# Patient Record
Sex: Female | Born: 1945 | Race: White | Hispanic: No | Marital: Married | Smoking: Former smoker
Health system: Southern US, Community
[De-identification: ages and names within clinical notes are randomized; demographics above are authoritative.]

## PROBLEM LIST (undated history)

## (undated) DIAGNOSIS — I10 Essential (primary) hypertension: Secondary | ICD-10-CM

## (undated) HISTORY — PX: KNEE SURGERY: SHX244

## (undated) HISTORY — PX: SHOULDER SURGERY: SHX246

---

## 2008-01-22 ENCOUNTER — Other Ambulatory Visit: Payer: Self-pay

## 2008-01-22 ENCOUNTER — Ambulatory Visit: Payer: Self-pay | Admitting: Unknown Physician Specialty

## 2008-01-31 ENCOUNTER — Ambulatory Visit: Payer: Self-pay | Admitting: Unknown Physician Specialty

## 2014-11-26 ENCOUNTER — Inpatient Hospital Stay (HOSPITAL_COMMUNITY): Payer: Medicare HMO

## 2014-11-26 ENCOUNTER — Emergency Department (HOSPITAL_COMMUNITY): Payer: Medicare HMO

## 2014-11-26 ENCOUNTER — Inpatient Hospital Stay (HOSPITAL_COMMUNITY)
Admission: EM | Admit: 2014-11-26 | Discharge: 2014-12-11 | DRG: 023 | Disposition: A | Discharge status: Hospice | Payer: Medicare HMO | Attending: Neurology | Admitting: Neurology

## 2014-11-26 ENCOUNTER — Encounter (HOSPITAL_COMMUNITY): Payer: Self-pay | Admitting: *Deleted

## 2014-11-26 DIAGNOSIS — Z8673 Personal history of transient ischemic attack (TIA), and cerebral infarction without residual deficits: Secondary | ICD-10-CM | POA: Diagnosis not present

## 2014-11-26 DIAGNOSIS — R471 Dysarthria and anarthria: Secondary | ICD-10-CM | POA: Diagnosis present

## 2014-11-26 DIAGNOSIS — G910 Communicating hydrocephalus: Secondary | ICD-10-CM | POA: Diagnosis not present

## 2014-11-26 DIAGNOSIS — G8191 Hemiplegia, unspecified affecting right dominant side: Secondary | ICD-10-CM | POA: Diagnosis not present

## 2014-11-26 DIAGNOSIS — G935 Compression of brain: Secondary | ICD-10-CM | POA: Diagnosis present

## 2014-11-26 DIAGNOSIS — Z880 Allergy status to penicillin: Secondary | ICD-10-CM

## 2014-11-26 DIAGNOSIS — Z882 Allergy status to sulfonamides status: Secondary | ICD-10-CM | POA: Diagnosis not present

## 2014-11-26 DIAGNOSIS — I615 Nontraumatic intracerebral hemorrhage, intraventricular: Secondary | ICD-10-CM | POA: Diagnosis present

## 2014-11-26 DIAGNOSIS — R0600 Dyspnea, unspecified: Secondary | ICD-10-CM | POA: Diagnosis not present

## 2014-11-26 DIAGNOSIS — R0689 Other abnormalities of breathing: Secondary | ICD-10-CM | POA: Diagnosis not present

## 2014-11-26 DIAGNOSIS — H547 Unspecified visual loss: Secondary | ICD-10-CM | POA: Diagnosis present

## 2014-11-26 DIAGNOSIS — Z978 Presence of other specified devices: Secondary | ICD-10-CM

## 2014-11-26 DIAGNOSIS — G936 Cerebral edema: Secondary | ICD-10-CM | POA: Diagnosis present

## 2014-11-26 DIAGNOSIS — R414 Neurologic neglect syndrome: Secondary | ICD-10-CM | POA: Diagnosis present

## 2014-11-26 DIAGNOSIS — Z885 Allergy status to narcotic agent status: Secondary | ICD-10-CM

## 2014-11-26 DIAGNOSIS — D649 Anemia, unspecified: Secondary | ICD-10-CM | POA: Diagnosis present

## 2014-11-26 DIAGNOSIS — I612 Nontraumatic intracerebral hemorrhage in hemisphere, unspecified: Secondary | ICD-10-CM | POA: Diagnosis not present

## 2014-11-26 DIAGNOSIS — Z0000 Encounter for general adult medical examination without abnormal findings: Secondary | ICD-10-CM

## 2014-11-26 DIAGNOSIS — R1310 Dysphagia, unspecified: Secondary | ICD-10-CM | POA: Diagnosis not present

## 2014-11-26 DIAGNOSIS — J9600 Acute respiratory failure, unspecified whether with hypoxia or hypercapnia: Secondary | ICD-10-CM | POA: Diagnosis not present

## 2014-11-26 DIAGNOSIS — H53461 Homonymous bilateral field defects, right side: Secondary | ICD-10-CM | POA: Diagnosis not present

## 2014-11-26 DIAGNOSIS — Y95 Nosocomial condition: Secondary | ICD-10-CM | POA: Diagnosis not present

## 2014-11-26 DIAGNOSIS — J189 Pneumonia, unspecified organism: Secondary | ICD-10-CM | POA: Diagnosis not present

## 2014-11-26 DIAGNOSIS — R001 Bradycardia, unspecified: Secondary | ICD-10-CM | POA: Diagnosis not present

## 2014-11-26 DIAGNOSIS — T8589XA Other specified complication of internal prosthetic devices, implants and grafts, not elsewhere classified, initial encounter: Secondary | ICD-10-CM | POA: Diagnosis not present

## 2014-11-26 DIAGNOSIS — E876 Hypokalemia: Secondary | ICD-10-CM | POA: Diagnosis present

## 2014-11-26 DIAGNOSIS — E871 Hypo-osmolality and hyponatremia: Secondary | ICD-10-CM | POA: Diagnosis not present

## 2014-11-26 DIAGNOSIS — R451 Restlessness and agitation: Secondary | ICD-10-CM | POA: Diagnosis not present

## 2014-11-26 DIAGNOSIS — R739 Hyperglycemia, unspecified: Secondary | ICD-10-CM | POA: Diagnosis not present

## 2014-11-26 DIAGNOSIS — J9601 Acute respiratory failure with hypoxia: Secondary | ICD-10-CM | POA: Diagnosis not present

## 2014-11-26 DIAGNOSIS — E785 Hyperlipidemia, unspecified: Secondary | ICD-10-CM | POA: Diagnosis present

## 2014-11-26 DIAGNOSIS — B9620 Unspecified Escherichia coli [E. coli] as the cause of diseases classified elsewhere: Secondary | ICD-10-CM | POA: Diagnosis not present

## 2014-11-26 DIAGNOSIS — Z452 Encounter for adjustment and management of vascular access device: Secondary | ICD-10-CM

## 2014-11-26 DIAGNOSIS — Z87891 Personal history of nicotine dependence: Secondary | ICD-10-CM | POA: Diagnosis not present

## 2014-11-26 DIAGNOSIS — R4701 Aphasia: Secondary | ICD-10-CM | POA: Diagnosis present

## 2014-11-26 DIAGNOSIS — I610 Nontraumatic intracerebral hemorrhage in hemisphere, subcortical: Principal | ICD-10-CM | POA: Diagnosis present

## 2014-11-26 DIAGNOSIS — Z09 Encounter for follow-up examination after completed treatment for conditions other than malignant neoplasm: Secondary | ICD-10-CM

## 2014-11-26 DIAGNOSIS — T380X5A Adverse effect of glucocorticoids and synthetic analogues, initial encounter: Secondary | ICD-10-CM | POA: Diagnosis present

## 2014-11-26 DIAGNOSIS — K5900 Constipation, unspecified: Secondary | ICD-10-CM | POA: Diagnosis present

## 2014-11-26 DIAGNOSIS — I639 Cerebral infarction, unspecified: Secondary | ICD-10-CM | POA: Diagnosis present

## 2014-11-26 DIAGNOSIS — Z66 Do not resuscitate: Secondary | ICD-10-CM | POA: Diagnosis present

## 2014-11-26 DIAGNOSIS — R339 Retention of urine, unspecified: Secondary | ICD-10-CM | POA: Diagnosis present

## 2014-11-26 DIAGNOSIS — R061 Stridor: Secondary | ICD-10-CM | POA: Insufficient documentation

## 2014-11-26 DIAGNOSIS — R29810 Facial weakness: Secondary | ICD-10-CM | POA: Diagnosis present

## 2014-11-26 DIAGNOSIS — I10 Essential (primary) hypertension: Secondary | ICD-10-CM | POA: Diagnosis present

## 2014-11-26 DIAGNOSIS — G8110 Spastic hemiplegia affecting unspecified side: Secondary | ICD-10-CM | POA: Diagnosis not present

## 2014-11-26 DIAGNOSIS — J690 Pneumonitis due to inhalation of food and vomit: Secondary | ICD-10-CM | POA: Diagnosis not present

## 2014-11-26 DIAGNOSIS — Z87898 Personal history of other specified conditions: Secondary | ICD-10-CM | POA: Diagnosis not present

## 2014-11-26 DIAGNOSIS — G8100 Flaccid hemiplegia affecting unspecified side: Secondary | ICD-10-CM | POA: Diagnosis not present

## 2014-11-26 DIAGNOSIS — I619 Nontraumatic intracerebral hemorrhage, unspecified: Secondary | ICD-10-CM | POA: Diagnosis present

## 2014-11-26 DIAGNOSIS — N3000 Acute cystitis without hematuria: Secondary | ICD-10-CM | POA: Diagnosis not present

## 2014-11-26 DIAGNOSIS — N390 Urinary tract infection, site not specified: Secondary | ICD-10-CM | POA: Diagnosis not present

## 2014-11-26 DIAGNOSIS — Z515 Encounter for palliative care: Secondary | ICD-10-CM | POA: Diagnosis not present

## 2014-11-26 DIAGNOSIS — G911 Obstructive hydrocephalus: Secondary | ICD-10-CM | POA: Diagnosis present

## 2014-11-26 DIAGNOSIS — F1099 Alcohol use, unspecified with unspecified alcohol-induced disorder: Secondary | ICD-10-CM

## 2014-11-26 DIAGNOSIS — T8589XD Other specified complication of internal prosthetic devices, implants and grafts, not elsewhere classified, subsequent encounter: Secondary | ICD-10-CM | POA: Diagnosis not present

## 2014-11-26 DIAGNOSIS — Z4659 Encounter for fitting and adjustment of other gastrointestinal appliance and device: Secondary | ICD-10-CM

## 2014-11-26 HISTORY — DX: Essential (primary) hypertension: I10

## 2014-11-26 LAB — COMPREHENSIVE METABOLIC PANEL
ALT: 30 U/L (ref 14–54)
AST: 30 U/L (ref 15–41)
Albumin: 4.4 g/dL (ref 3.5–5.0)
Alkaline Phosphatase: 66 U/L (ref 38–126)
Anion gap: 12 (ref 5–15)
BILIRUBIN TOTAL: 1.1 mg/dL (ref 0.3–1.2)
BUN: 14 mg/dL (ref 6–20)
CO2: 22 mmol/L (ref 22–32)
CREATININE: 0.66 mg/dL (ref 0.44–1.00)
Calcium: 9.7 mg/dL (ref 8.9–10.3)
Chloride: 98 mmol/L — ABNORMAL LOW (ref 101–111)
GFR calc non Af Amer: >60 mL/min (ref >60)
GLUCOSE: 183 mg/dL — AB (ref 65–99)
Potassium: 4.1 mmol/L (ref 3.5–5.1)
Sodium: 132 mmol/L — ABNORMAL LOW (ref 135–145)
TOTAL PROTEIN: 7.6 g/dL (ref 6.5–8.1)

## 2014-11-26 LAB — DIFFERENTIAL
BASOS ABS: 0 10*3/uL (ref 0.0–0.1)
Basophils Relative: 0 % (ref 0–1)
EOS PCT: 0 % (ref 0–5)
Eosinophils Absolute: 0 10*3/uL (ref 0.0–0.7)
LYMPHS ABS: 0.6 10*3/uL — AB (ref 0.7–4.0)
LYMPHS PCT: 5 % — AB (ref 12–46)
MONOS PCT: 5 % (ref 3–12)
Monocytes Absolute: 0.6 10*3/uL (ref 0.1–1.0)
Neutro Abs: 11.7 10*3/uL — ABNORMAL HIGH (ref 1.7–7.7)
Neutrophils Relative %: 90 % — ABNORMAL HIGH (ref 43–77)

## 2014-11-26 LAB — APTT: APTT: 20 s — AB (ref 24–37)

## 2014-11-26 LAB — CBC
HEMATOCRIT: 40.1 % (ref 36.0–46.0)
HEMOGLOBIN: 14.1 g/dL (ref 12.0–15.0)
MCH: 34.1 pg — AB (ref 26.0–34.0)
MCHC: 35.2 g/dL (ref 30.0–36.0)
MCV: 97.1 fL (ref 78.0–100.0)
Platelets: 209 10*3/uL (ref 150–400)
RBC: 4.13 MIL/uL (ref 3.87–5.11)
RDW: 12.6 % (ref 11.5–15.5)
WBC: 13 10*3/uL — ABNORMAL HIGH (ref 4.0–10.5)

## 2014-11-26 LAB — LIPID PANEL
CHOL/HDL RATIO: 2.9 ratio
CHOLESTEROL: 226 mg/dL — AB (ref 0–200)
HDL: 78 mg/dL (ref >40)
LDL CALC: 139 mg/dL — AB (ref 0–99)
Triglycerides: 47 mg/dL (ref <150)
VLDL: 9 mg/dL (ref 0–40)

## 2014-11-26 LAB — PROTIME-INR
INR: 0.92 (ref 0.00–1.49)
Prothrombin Time: 12.4 seconds (ref 11.6–15.2)

## 2014-11-26 LAB — POCT I-STAT, CHEM 8
BUN: 17 mg/dL (ref 6–20)
CALCIUM ION: 1.09 mmol/L — AB (ref 1.13–1.30)
CHLORIDE: 97 mmol/L — AB (ref 101–111)
CREATININE: 0.6 mg/dL (ref 0.44–1.00)
Glucose, Bld: 189 mg/dL — ABNORMAL HIGH (ref 65–99)
HCT: 45 % (ref 36.0–46.0)
HEMOGLOBIN: 15.3 g/dL — AB (ref 12.0–15.0)
POTASSIUM: 4.1 mmol/L (ref 3.5–5.1)
Sodium: 132 mmol/L — ABNORMAL LOW (ref 135–145)
TCO2: 22 mmol/L (ref 0–100)

## 2014-11-26 LAB — MRSA PCR SCREENING: MRSA BY PCR: NEGATIVE

## 2014-11-26 LAB — CBG MONITORING, ED: Glucose-Capillary: 195 mg/dL — ABNORMAL HIGH (ref 65–99)

## 2014-11-26 LAB — SODIUM: Sodium: 133 mmol/L — ABNORMAL LOW (ref 135–145)

## 2014-11-26 LAB — POCT I-STAT TROPONIN I: Troponin i, poc: 0 ng/mL (ref 0.00–0.08)

## 2014-11-26 LAB — TSH: TSH: 0.629 u[IU]/mL (ref 0.350–4.500)

## 2014-11-26 MED ORDER — PNEUMOCOCCAL VAC POLYVALENT 25 MCG/0.5ML IJ INJ
0.5000 mL | INJECTION | INTRAMUSCULAR | Status: AC
  Administered 2014-11-27: 0.5 mL via INTRAMUSCULAR
  Filled 2014-11-26: qty 0.5

## 2014-11-26 MED ORDER — HYDRALAZINE HCL 20 MG/ML IJ SOLN
10.0000 mg | Freq: Once | INTRAMUSCULAR | Status: AC
  Administered 2014-11-26: 10 mg via INTRAVENOUS
  Filled 2014-11-26: qty 1

## 2014-11-26 MED ORDER — ACETAMINOPHEN 650 MG RE SUPP: 650.0000 mg | RECTAL | Status: DC | PRN

## 2014-11-26 MED ORDER — ONDANSETRON HCL 4 MG/2ML IJ SOLN
4.0000 mg | Freq: Four times a day (QID) | INTRAMUSCULAR | Status: DC | PRN
  Administered 2014-11-27: 4 mg via INTRAVENOUS
  Filled 2014-11-26: qty 2

## 2014-11-26 MED ORDER — STROKE: EARLY STAGES OF RECOVERY BOOK
Freq: Once | Status: AC
  Administered 2014-11-26: 07:00:00
  Filled 2014-11-26: qty 1

## 2014-11-26 MED ORDER — LABETALOL HCL 5 MG/ML IV SOLN
20.0000 mg | Freq: Once | INTRAVENOUS | Status: AC
  Administered 2014-11-26: 20 mg via INTRAVENOUS

## 2014-11-26 MED ORDER — SODIUM CHLORIDE 0.9 % IV SOLN
INTRAVENOUS | Status: DC
  Administered 2014-11-26 – 2014-11-29 (×3): via INTRAVENOUS

## 2014-11-26 MED ORDER — ACETAMINOPHEN 325 MG PO TABS
650.0000 mg | ORAL_TABLET | ORAL | Status: DC | PRN
  Administered 2014-12-05 – 2014-12-09 (×6): 650 mg via ORAL
  Filled 2014-11-26 (×6): qty 2

## 2014-11-26 MED ORDER — PANTOPRAZOLE SODIUM 40 MG IV SOLR
40.0000 mg | Freq: Every day | INTRAVENOUS | Status: DC
  Administered 2014-11-26: 40 mg via INTRAVENOUS
  Filled 2014-11-26 (×2): qty 40

## 2014-11-26 MED ORDER — CETYLPYRIDINIUM CHLORIDE 0.05 % MT LIQD
7.0000 mL | Freq: Two times a day (BID) | OROMUCOSAL | Status: DC
  Administered 2014-11-26 – 2014-11-29 (×7): 7 mL via OROMUCOSAL

## 2014-11-26 MED ORDER — CLEVIDIPINE BUTYRATE 0.5 MG/ML IV EMUL
0.0000 mg/h | INTRAVENOUS | Status: DC
  Administered 2014-11-27: 6 mg/h via INTRAVENOUS
  Administered 2014-11-27: 1 mg/h via INTRAVENOUS
  Administered 2014-11-27: 5 mg/h via INTRAVENOUS
  Filled 2014-11-26 (×62): qty 50

## 2014-11-26 MED ORDER — LABETALOL HCL 5 MG/ML IV SOLN
10.0000 mg | INTRAVENOUS | Status: DC | PRN
  Administered 2014-11-26 (×3): 20 mg via INTRAVENOUS
  Administered 2014-11-26: 40 mg via INTRAVENOUS
  Administered 2014-11-26 – 2014-11-27 (×3): 20 mg via INTRAVENOUS
  Administered 2014-11-28: 40 mg via INTRAVENOUS
  Administered 2014-11-28 (×2): 20 mg via INTRAVENOUS
  Filled 2014-11-26: qty 8
  Filled 2014-11-26 (×5): qty 4
  Filled 2014-11-26: qty 8
  Filled 2014-11-26 (×3): qty 4

## 2014-11-26 MED ORDER — ONDANSETRON HCL 4 MG/2ML IJ SOLN
4.0000 mg | Freq: Once | INTRAMUSCULAR | Status: AC
  Administered 2014-11-26: 4 mg via INTRAVENOUS
  Filled 2014-11-26: qty 2

## 2014-11-26 NOTE — ED Provider Notes (Signed)
CSN: [id number]     Arrival date & time 11/26/14  0506 History   First MD Initiated Contact with Patient 11/26/14 0518     Chief Complaint  Patient presents with  . Code Stroke    An emergency department physician performed an initial assessment on this suspected stroke patient at [id number]. (Consider location/radiation/quality/duration/timing/severity/associated sxs/prior Treatment) The history is provided by the EMS personnel. The history is limited by the condition of the patient (Aphasia).   69 year old female is brought in by ambulance as a code stroke. She was last seen normal low when her husband went to bed at 9 AM. He will found her on the floor at 4 AM and was noted to not be moving her right side. Patient is able to nod her head yes and states that she is not hurting anywhere.  No past medical history on file. No past surgical history on file. No family history on file. History  Substance Use Topics  . Smoking status: Not on file  . Smokeless tobacco: Not on file  . Alcohol Use: Not on file   OB History    No data available     Review of Systems  Unable to perform ROS: Patient nonverbal      Allergies  Penicillins and Codeine  Home Medications   Prior to Admission medications   Not on File   BP 184/72 mmHg  Pulse 98  Temp(Src) 98.2 F (36.8 C) (Oral)  Resp 29  Ht 5\' 4"  (1.626 m)  Wt 160 lb 4.4 oz (72.7 kg)  BMI 27.50 kg/m2  SpO2 100% Physical Exam  Nursing note and vitals reviewed.  69 year old female, resting comfortably and in no acute distress. Vital signs are significant for hypertension and tachypnea. Oxygen saturation is 100%, which is normal. Head is normocephalic and atraumatic. Pupils are 3 mm and sluggishly reactive Oropharynx is clear. Neck is nontender and supple without adenopathy or JVD. There are no carotid bruits. Back is nontender and there is no CVA tenderness. Lungs are clear without rales, wheezes, or rhonchi. Chest is  nontender. Heart has regular rate and rhythm without murmur. Abdomen is soft, flat, nontender without masses or hepatosplenomegaly and peristalsis is normoactive. Extremities have no cyanosis or edema, full range of motion is present. Skin is warm and dry without rash. Neurologic: She is awake but does not follow commands well. Mild right central facial droop is present. Dense right hemiparesis is present and right Babinski reflex is present.  ED Course  Procedures (including critical care time) Labs Review Results for orders placed or performed during the hospital encounter of 11/26/14  Protime-INR  Result Value Ref Range   Prothrombin Time 12.4 11.6 - 15.2 seconds   INR 0.92 0.00 - 1.49  APTT  Result Value Ref Range   aPTT 20 (L) 24 - 37 seconds  CBC  Result Value Ref Range   WBC 13.0 (H) 4.0 - 10.5 K/uL   RBC 4.13 3.87 - 5.11 MIL/uL   Hemoglobin 14.1 12.0 - 15.0 g/dL   HCT 40.940.1 81.136.0 - 91.446.0 %   MCV 97.1 78.0 - 100.0 fL   MCH 34.1 (H) 26.0 - 34.0 pg   MCHC 35.2 30.0 - 36.0 g/dL   RDW 78.212.6 95.611.5 - 21.315.5 %   Platelets 209 150 - 400 K/uL  Differential  Result Value Ref Range   Neutrophils Relative % 90 (H) 43 - 77 %   Neutro Abs 11.7 (H) 1.7 - 7.7 K/uL  Lymphocytes Relative 5 (L) 12 - 46 %   Lymphs Abs 0.6 (L) 0.7 - 4.0 K/uL   Monocytes Relative 5 3 - 12 %   Monocytes Absolute 0.6 0.1 - 1.0 K/uL   Eosinophils Relative 0 0 - 5 %   Eosinophils Absolute 0.0 0.0 - 0.7 K/uL   Basophils Relative 0 0 - 1 %   Basophils Absolute 0.0 0.0 - 0.1 K/uL  Comprehensive metabolic panel  Result Value Ref Range   Sodium 132 (L) 135 - 145 mmol/L   Potassium 4.1 3.5 - 5.1 mmol/L   Chloride 98 (L) 101 - 111 mmol/L   CO2 22 22 - 32 mmol/L   Glucose, Bld 183 (H) 65 - 99 mg/dL   BUN 14 6 - 20 mg/dL   Creatinine, Ser 1.61 0.44 - 1.00 mg/dL   Calcium 9.7 8.9 - 09.6 mg/dL   Total Protein 7.6 6.5 - 8.1 g/dL   Albumin 4.4 3.5 - 5.0 g/dL   AST 30 15 - 41 U/L   ALT 30 14 - 54 U/L   Alkaline  Phosphatase 66 38 - 126 U/L   Total Bilirubin 1.1 0.3 - 1.2 mg/dL   GFR calc non Af Amer >60 >60 mL/min   GFR calc Af Amer >60 >60 mL/min   Anion gap 12 5 - 15  CBG monitoring, ED  Result Value Ref Range   Glucose-Capillary 195 (H) 65 - 99 mg/dL   Imaging Review Ct Head (brain) Wo Contrast  11/26/2014   CLINICAL DATA:  Code stroke.  Right-sided deficits.  Vomiting.  EXAM: CT HEAD WITHOUT CONTRAST  TECHNIQUE: Contiguous axial images were obtained from the base of the skull through the vertex without intravenous contrast.  COMPARISON:  None.  FINDINGS: There is an acute 3.7 x 1.9 cm left basal gangliar bleed, with decompression into the ventricles. Moderate amount of intraventricular blood dependently, left greater than right. There is 5 mm left-to-right midline shift. Mild ventricular prominence likely related to central atrophy, no frank hydrocephalus. The basilar cisterns remain patent. No subdural hemorrhage. Minimal inflammatory change in right side of sphenoid sinus. Calvarium is intact. The mastoid air cells are well aerated.  IMPRESSION: Acute 3.7 cm bleed in the left basal ganglia with decompression into the ventricles, moderate volume of intraventricular blood. There is 5 mm left-to-right midline shift.  Critical Value/emergent results were called by telephone at the time of interpretation on 11/26/2014 at 5:25 am to Dr. Roseanne Reno , who verbally acknowledged these results.   Electronically Signed   By: Rubye Oaks M.D.   On: 11/26/2014 05:26   Images viewed by me.   EKG Interpretation   Date/Time:  Wednesday Nov 26 2014 05:24:20 EDT Ventricular Rate:  91 PR Interval:  179 QRS Duration: 97 QT Interval:  388 QTC Calculation: 477 R Axis:   44 Text Interpretation:  Sinus rhythm RSR' in V1 or V2, probably normal  variant When compared with ECG of 01/22/2008, No significant change was  found Confirmed by [facility]  MD, Onaje Warne ([id number]) on 11/26/2014 5:35:37 AM      CRITICAL CARE Performed  by: [email or web address],JYNWG Total critical care time: 35 minutes Critical care time was exclusive of separately billable procedures and treating other patients. Critical care was necessary to treat or prevent imminent or life-threatening deterioration. Critical care was time spent personally by me on the following activities: development of treatment plan with patient and/or surrogate as well as nursing, discussions with consultants, evaluation of patient's response to treatment, examination  of patient, obtaining history from patient or surrogate, ordering and performing treatments and interventions, ordering and review of laboratory studies, ordering and review of radiographic studies, pulse oximetry and re-evaluation of patient's condition.  MDM   Final diagnoses:  Left Basal Ganglia Bleed    Stroke with right hemiparesis and aphasia. CT scan is obtained showing a left basal ganglia bleed. Case was seen in conjunction with Dr. Roseanne RenoStewart of neurology services and he is admitting the patient to the neurology service.  Her husband has arrived and I have discussed the CT findings with him and their implication.   Dione Boozeavid Jacalyn Biggs, MD 11/26/14 [phone]

## 2014-11-26 NOTE — Progress Notes (Signed)
STROKE TEAM PROGRESS NOTE   SUBJECTIVE (INTERVAL HISTORY) Her family member are at the bedside.  Overall her condition is not stable yet. She is still very lethargic with aphasia and right side hemiplegia. BP fluctuating and put on cleviprex drip. BP goal < 140.   OBJECTIVE Temp:  [98.2 F (36.8 C)-98.6 F (37 C)] 98.2 F (36.8 C) (05/18 0750) Pulse Rate:  [82-98] 84 (05/18 1300) Cardiac Rhythm:  [-] Normal sinus rhythm (05/18 0657) Resp:  [18-29] 21 (05/18 1300) BP: (100-200)/(53-96) 153/64 mmHg (05/18 1300) SpO2:  [94 %-100 %] 94 % (05/18 1300) Weight:  [160 lb 4.4 oz (72.7 kg)] 160 lb 4.4 oz (72.7 kg) (05/18 0657)   Recent Labs Lab 11/26/14 0536  GLUCAP 195*    Recent Labs Lab 11/26/14 0510 11/26/14 0517  NA 132* 132*  K 4.1 4.1  CL 98* 97*  CO2 22  --   GLUCOSE 183* 189*  BUN 14 17  CREATININE 0.66 0.60  CALCIUM 9.7  --     Recent Labs Lab 11/26/14 0510  AST 30  ALT 30  ALKPHOS 66  BILITOT 1.1  PROT 7.6  ALBUMIN 4.4    Recent Labs Lab 11/26/14 0510 11/26/14 0517  WBC 13.0*  --   NEUTROABS 11.7*  --   HGB 14.1 15.3*  HCT 40.1 45.0  MCV 97.1  --   PLT 209  --    No results for input(s): CKTOTAL, CKMB, CKMBINDEX, TROPONINI in the last 168 hours.  Recent Labs  11/26/14 0510  LABPROT 12.4  INR 0.92   No results for input(s): COLORURINE, LABSPEC, PHURINE, GLUCOSEU, HGBUR, BILIRUBINUR, KETONESUR, PROTEINUR, UROBILINOGEN, NITRITE, LEUKOCYTESUR in the last 72 hours.  Invalid input(s): APPERANCEUR     Component Value Date/Time   CHOL 226* 11/26/2014 1200   TRIG 47 11/26/2014 1200   HDL 78 11/26/2014 1200   CHOLHDL 2.9 11/26/2014 1200   VLDL 9 11/26/2014 1200   LDLCALC 139* 11/26/2014 1200   No results found for: HGBA1C No results found for: LABOPIA, COCAINSCRNUR, LABBENZ, AMPHETMU, THCU, LABBARB  No results for input(s): ETH in the last 168 hours.  I have personally reviewed the radiological images below and agree with the radiology  interpretations.  Ct Head (brain) Wo Contrast  11/26/2014   IMPRESSION: Acute 3.7 cm bleed in the left basal ganglia with decompression into the ventricles, moderate volume of intraventricular blood. There is 5 mm left-to-right midline shift.     Chest Port 1 View  11/26/2014    IMPRESSION: Prominence of the pulmonary interstitial markings and probable small left pleural effusion. Without previous studies it is not possible no with these findings are acute or old. A PA and lateral chest x-ray with deep inspiratory effort would be useful.    2D Echocardiogram  pending  EKG  normal sinus rhythm. For complete results please see formal report.  PHYSICAL EXAM  Temp:  [98.2 F (36.8 C)-98.6 F (37 C)] 98.2 F (36.8 C) (05/18 0750) Pulse Rate:  [82-98] 84 (05/18 1300) Resp:  [18-29] 21 (05/18 1300) BP: (100-200)/(53-96) 153/64 mmHg (05/18 1300) SpO2:  [94 %-100 %] 94 % (05/18 1300) Weight:  [160 lb 4.4 oz (72.7 kg)] 160 lb 4.4 oz (72.7 kg) (05/18 0657)  General - Well nourished, well developed, lethargic and drowsy.  Ophthalmologic - not cooperative on exam.  Cardiovascular - Regular rate and rhythm with no murmur.  Neuro - very lethargic and drowsy, fall back to sleep without stimulation. Non verbal and not following  commands. Eyes open on voice, disconjugated eyes, gaze left preference, able to cross midline, not blinking to visual threat on the right, facial symmetrical, tongue in middle in mouth. Right UE 0/5 and RLE 2+/5 on pain stimulation. LUE and LLE spontaneous movement. Right babinski positive. Reflex 1+. Sensation, coordination and gait not tested.   ASSESSMENT/PLAN Kathryn Khan is a 69 y.o. female with history of HTN admitted for left BG hemorrhage with intraventricular extension. Symptoms stable but guarded.    Left BG hemorrhage with intraventricular extension - likely due to hypertensive bleed  CT showed left BG hemorrhage with ventricular extension  Repeat CT  3pm today  2D Echo  pending  LDL 139, not at the goal  [id number] pending  SCDs  for VTE prophylaxis  Diet NPO time specified   no antithrombotic prior to admission, now on no antithrombotic  BP control with goal < 140  Ongoing aggressive stroke risk factor management  Therapy recommendations:  pending  Disposition:  Pending  Cerebral edema  Currently no need for hypertonic saline  Repeat CT this pm to evaluate midline shift  3% saline if needed  BP control  Full code  Close neuro check  Hypertension  Home meds:   lisinopril Currently on cleviprex drip BP goal < 140 Consider po meds once passed swallow  Unstable  Patient counseled to be compliant with her blood pressure medications  Hyperlipidemia  Home meds:  none   Currently on none  LDL 139, goal < 70  Hold off statin now due to ICH and no po access  Continue statin at discharge  Other Stroke Risk Factors  Advanced age  ETOH use - 2-3 drinks daily  Other Active Problems  Hyponatremia - repeat Na 2pm today  leukocytosis  Other Pertinent History  Not check BP at home  Hospital day # 0  This patient is critically ill due to ICH with ventricular extension and at significant risk of neurological worsening, death form hematoma extension, cerebral edema and brain herniation. This patient's care requires constant monitoring of vital signs, hemodynamics, respiratory and cardiac monitoring, review of multiple databases, neurological assessment, discussion with family, other specialists and medical decision making of high complexity. I spent 50 minutes of neurocritical care time in the care of this patient.   Marvel PlanJindong Amire Leazer, MD PhD Stroke Neurology 11/26/2014 1:31 PM    To contact Stroke Continuity provider, please refer to [email or web address]. After hours, contact General Neurology

## 2014-11-26 NOTE — Code Documentation (Signed)
Kathryn Khan is a 69yo wf who was found on the floor by her husband when he got up to go to work.  He last saw her normal at 0900 when he went to bed.  She was watching TV so she did not go to bed at that time.  Upon EMS arrival she was hypertensive BP 220/117 and was flaccid on the Rt side.  NIH 24 for Rt side flaccid, facial droop, global aphasia, and sensory deficit.

## 2014-11-26 NOTE — ED Notes (Signed)
Pt CBG, 195. Nurse was notified.

## 2014-11-26 NOTE — H&P (Addendum)
Admission H&P    Chief Complaint: New onset aphasia and right hemiplegia.  HPI: Kathryn Khan is an 69 y.o. female with a history of hypertension, brought to the emergency room and code stroke status after being found on the floor by her husband at 4 AM this morning. She was unable to speak and was not moving her right side. She was last seen well at 9 PM asked night. She was noted to be hypertensive with blood pressure of 200/117. CT scan of her head showed an acute 3.7 cm hemorrhage involving left basal ganglia with extension into the left lateral ventricle. There was a 5 mm left-to-right midline shift. Patient was nauseated and was given Zofran milligrams IV, in addition to 20 mg of labetalol for management of hypertensive urgency. NIH stroke score was 24. Patient was admitted to neuro intensive care unit.  LSN: 9 PM on 11/25/2014 tPA Given: No: Acute ICH/IVH mRankin:  Past medical history: Hypertension  Surgical history: Unavailable..  Family history: Negative for stroke, heart disease and hypertension.  Social History:  has no tobacco, alcohol, and drug history on file.   Allergies:  Allergies  Allergen Reactions  . Penicillins Anaphylaxis  . Codeine Nausea And Vomiting    Medications: Lisinopril  ROS: History obtained from patient spouse and daughter.  General ROS: negative for - chills, fatigue, fever, night sweats, weight gain or weight loss Psychological ROS: negative for - behavioral disorder, hallucinations, memory difficulties, mood swings or suicidal ideation Ophthalmic ROS: negative for - blurry vision, double vision, eye pain or loss of vision ENT ROS: negative for - epistaxis, nasal discharge, oral lesions, sore throat, tinnitus or vertigo Allergy and Immunology ROS: negative for - hives or itchy/watery eyes Hematological and Lymphatic ROS: negative for - bleeding problems, bruising or swollen lymph nodes Endocrine ROS: negative for - galactorrhea, hair  pattern changes, polydipsia/polyuria or temperature intolerance Respiratory ROS: negative for - cough, hemoptysis, shortness of breath or wheezing Cardiovascular ROS: negative for - chest pain, dyspnea on exertion, edema or irregular heartbeat Gastrointestinal ROS: negative for - abdominal pain, diarrhea, hematemesis, nausea/vomiting or stool incontinence Genito-Urinary ROS: negative for - dysuria, hematuria, incontinence or urinary frequency/urgency Musculoskeletal ROS: negative for - joint swelling or muscular weakness Neurological ROS: as noted in HPI Dermatological ROS: negative for rash and skin lesion changes  Physical Examination: Vital signs: Temperature 98.6, pulse 84, respirations 23, blood pressure 188/96.  Patient was lethargic but in no acute distress.  HEENT-  Normocephalic, no lesions, without obvious abnormality.  Normal external eye and conjunctiva.  Normal TM's bilaterally.  Normal auditory canals and external ears. Normal external nose, mucus membranes and septum.  Normal pharynx. Neck supple with no masses, nodes, nodules or enlargement. Cardiovascular - regular rate and rhythm, S1, S2 normal, no murmur, click, rub or gallop Lungs - chest clear, no wheezing, rales, normal symmetric air entry Abdomen - soft, non-tender; bowel sounds normal; no masses,  no organomegaly Extremities - no joint deformities, effusion, or inflammation and no edema  Neurologic Examination: Mental Status: Lethargic, globally aphasic, unable to follow any commands. She had total neglect of right side. Cranial Nerves: II-dense right homonymous hemianopsia. III/IV/VI-Pupils were equal and reacted. Gaze deviation to the left side with no movement towards the right beyond midline.    VII-moderate right lower facial weakness. VIII-unable to adequately assess. X-no speech output. XI: trapezius strength/neck flexion strength normal bilaterally XII-midline tongue extension with normal  strength. Motor: Flaccid right hemiplegia; normal strength of left extremities. Sensory:  Total neglect of left side. Deep Tendon Reflexes: 2+ and symmetric. Plantars: Extensor on the right and flexor on the left. Cerebellar: Unable to assess. Carotid auscultation: Normal  Results for orders placed or performed during the hospital encounter of 11/26/14 (from the past 48 hour(s))  Protime-INR     Status: None   Collection Time: 11/26/14  5:10 AM  Result Value Ref Range   Prothrombin Time 12.4 11.6 - 15.2 seconds   INR 0.92 0.00 - 1.49  APTT     Status: Abnormal   Collection Time: 11/26/14  5:10 AM  Result Value Ref Range   aPTT 20 (L) 24 - 37 seconds  CBC     Status: Abnormal   Collection Time: 11/26/14  5:10 AM  Result Value Ref Range   WBC 13.0 (H) 4.0 - 10.5 K/uL   RBC 4.13 3.87 - 5.11 MIL/uL   Hemoglobin 14.1 12.0 - 15.0 g/dL   HCT 54.0 98.1 - 19.1 %   MCV 97.1 78.0 - 100.0 fL   MCH 34.1 (H) 26.0 - 34.0 pg   MCHC 35.2 30.0 - 36.0 g/dL   RDW 47.8 29.5 - 62.1 %   Platelets 209 150 - 400 K/uL  Differential     Status: Abnormal   Collection Time: 11/26/14  5:10 AM  Result Value Ref Range   Neutrophils Relative % 90 (H) 43 - 77 %   Neutro Abs 11.7 (H) 1.7 - 7.7 K/uL   Lymphocytes Relative 5 (L) 12 - 46 %   Lymphs Abs 0.6 (L) 0.7 - 4.0 K/uL   Monocytes Relative 5 3 - 12 %   Monocytes Absolute 0.6 0.1 - 1.0 K/uL   Eosinophils Relative 0 0 - 5 %   Eosinophils Absolute 0.0 0.0 - 0.7 K/uL   Basophils Relative 0 0 - 1 %   Basophils Absolute 0.0 0.0 - 0.1 K/uL  CBG monitoring, ED     Status: Abnormal   Collection Time: 11/26/14  5:36 AM  Result Value Ref Range   Glucose-Capillary 195 (H) 65 - 99 mg/dL   Ct Head (brain) Wo Contrast  11/26/2014   CLINICAL DATA:  Code stroke.  Right-sided deficits.  Vomiting.  EXAM: CT HEAD WITHOUT CONTRAST  TECHNIQUE: Contiguous axial images were obtained from the base of the skull through the vertex without intravenous contrast.  COMPARISON:   None.  FINDINGS: There is an acute 3.7 x 1.9 cm left basal gangliar bleed, with decompression into the ventricles. Moderate amount of intraventricular blood dependently, left greater than right. There is 5 mm left-to-right midline shift. Mild ventricular prominence likely related to central atrophy, no frank hydrocephalus. The basilar cisterns remain patent. No subdural hemorrhage. Minimal inflammatory change in right side of sphenoid sinus. Calvarium is intact. The mastoid air cells are well aerated.  IMPRESSION: Acute 3.7 cm bleed in the left basal ganglia with decompression into the ventricles, moderate volume of intraventricular blood. There is 5 mm left-to-right midline shift.  Critical Value/emergent results were called by telephone at the time of interpretation on 11/26/2014 at 5:25 am to Dr. Roseanne Reno , who verbally acknowledged these results.   Electronically Signed   By: Rubye Oaks M.D.   On: 11/26/2014 05:26    Assessment: 69 y.o. female presenting with a large left basal ganglia hemorrhage with ventricular extension and mild mass effect, as well as hypertensive urgency.  Stroke Risk Factors - hypertension  Plan: 1. HgbA1c, fasting lipid panel 2. MRI, MRA  of the brain without  contrast 3. PT consult, OT consult, Speech consult 4. Echocardiogram 5. Carotid dopplers 6. Prophylactic therapy-None 7. Risk factor modification 8. Telemetry monitoring  This patient is critically ill and at significant risk of neurological worsening or death, and care requires constant monitoring of vital signs, hemodynamics,respiratory and cardiac monitoring, neurological assessment, discussion with family, other specialists and medical decision making of high complexity. Total critical care time was 60 minutes.  C.R. Roseanne RenoStewart, MD [facility] [phone]  11/26/2014, 5:44 AM

## 2014-11-26 NOTE — Progress Notes (Signed)
UR completed.  Await acute therapy evals to determine pt's needs for d/c.   Carlyle LipaMichelle Charitie Hinote, RN BSN MHA CCM Trauma/Neuro ICU Case Manager [phone]

## 2014-11-26 NOTE — ED Notes (Signed)
Pt arrives from home via [facility]. Pt was found on the floor near her chair by husband when he got up for work around 4AM. Pt has right sided paralysis, aphasic, with signs of vomiting on clothing.

## 2014-11-27 ENCOUNTER — Inpatient Hospital Stay (HOSPITAL_COMMUNITY): Payer: Medicare HMO

## 2014-11-27 DIAGNOSIS — I610 Nontraumatic intracerebral hemorrhage in hemisphere, subcortical: Secondary | ICD-10-CM | POA: Diagnosis not present

## 2014-11-27 DIAGNOSIS — R4701 Aphasia: Secondary | ICD-10-CM

## 2014-11-27 DIAGNOSIS — N3000 Acute cystitis without hematuria: Secondary | ICD-10-CM

## 2014-11-27 DIAGNOSIS — G8110 Spastic hemiplegia affecting unspecified side: Secondary | ICD-10-CM

## 2014-11-27 LAB — HEMOGLOBIN A1C
Hgb A1c MFr Bld: 5.7 % — ABNORMAL HIGH (ref 4.8–5.6)
Mean Plasma Glucose: 117 mg/dL

## 2014-11-27 LAB — CBC
HEMATOCRIT: 39.2 % (ref 36.0–46.0)
Hemoglobin: 13 g/dL (ref 12.0–15.0)
MCH: 32.4 pg (ref 26.0–34.0)
MCHC: 33.2 g/dL (ref 30.0–36.0)
MCV: 97.8 fL (ref 78.0–100.0)
PLATELETS: 200 10*3/uL (ref 150–400)
RBC: 4.01 MIL/uL (ref 3.87–5.11)
RDW: 12.8 % (ref 11.5–15.5)
WBC: 8.8 10*3/uL (ref 4.0–10.5)

## 2014-11-27 LAB — URINE MICROSCOPIC-ADD ON

## 2014-11-27 LAB — URINALYSIS, ROUTINE W REFLEX MICROSCOPIC
Bilirubin Urine: NEGATIVE
GLUCOSE, UA: NEGATIVE mg/dL
KETONES UR: 15 mg/dL — AB
Nitrite: NEGATIVE
PH: 6 (ref 5.0–8.0)
PROTEIN: NEGATIVE mg/dL
Specific Gravity, Urine: 1.015 (ref 1.005–1.030)
Urobilinogen, UA: 0.2 mg/dL (ref 0.0–1.0)

## 2014-11-27 LAB — SODIUM, URINE, RANDOM: Sodium, Ur: 183 mmol/L

## 2014-11-27 LAB — BASIC METABOLIC PANEL
Anion gap: 10 (ref 5–15)
BUN: 6 mg/dL (ref 6–20)
CO2: 24 mmol/L (ref 22–32)
CREATININE: 0.48 mg/dL (ref 0.44–1.00)
Calcium: 8.8 mg/dL — ABNORMAL LOW (ref 8.9–10.3)
Chloride: 97 mmol/L — ABNORMAL LOW (ref 101–111)
GFR calc Af Amer: >60 mL/min (ref >60)
GFR calc non Af Amer: >60 mL/min (ref >60)
Glucose, Bld: 173 mg/dL — ABNORMAL HIGH (ref 65–99)
Potassium: 3.1 mmol/L — ABNORMAL LOW (ref 3.5–5.1)
Sodium: 131 mmol/L — ABNORMAL LOW (ref 135–145)

## 2014-11-27 LAB — OSMOLALITY, URINE: OSMOLALITY UR: 580 mosm/kg (ref 390–1090)

## 2014-11-27 LAB — CREATININE, URINE, RANDOM: Creatinine, Urine: 51.33 mg/dL

## 2014-11-27 MED ORDER — PANTOPRAZOLE SODIUM 40 MG PO TBEC
40.0000 mg | DELAYED_RELEASE_TABLET | Freq: Every day | ORAL | Status: DC
  Administered 2014-11-27 – 2014-11-30 (×4): 40 mg via ORAL
  Filled 2014-11-27 (×4): qty 1

## 2014-11-27 MED ORDER — CIPROFLOXACIN IN D5W 400 MG/200ML IV SOLN
400.0000 mg | Freq: Two times a day (BID) | INTRAVENOUS | Status: DC
  Administered 2014-11-27 – 2014-11-28 (×3): 400 mg via INTRAVENOUS
  Filled 2014-11-27 (×4): qty 200

## 2014-11-27 MED ORDER — LISINOPRIL 20 MG PO TABS
20.0000 mg | ORAL_TABLET | Freq: Every day | ORAL | Status: DC
  Administered 2014-11-27: 20 mg via ORAL
  Filled 2014-11-27 (×2): qty 1

## 2014-11-27 MED ORDER — SODIUM CHLORIDE 1 G PO TABS
1.0000 g | ORAL_TABLET | Freq: Three times a day (TID) | ORAL | Status: DC
  Administered 2014-11-27 – 2014-11-28 (×3): 1 g via ORAL
  Filled 2014-11-27 (×6): qty 1

## 2014-11-27 MED ORDER — AMLODIPINE BESYLATE 5 MG PO TABS
5.0000 mg | ORAL_TABLET | Freq: Every day | ORAL | Status: DC
  Administered 2014-11-27: 5 mg via ORAL
  Filled 2014-11-27 (×2): qty 1

## 2014-11-27 NOTE — Evaluation (Signed)
Physical Therapy Evaluation Patient Details Name: Kathryn Khan MRN: [id number] DOB: 08/09/1945 Today's Date: 11/27/2014   History of Present Illness  Kathryn Khan is an 69 y.o. female with a history of hypertension, brought to the emergency room and code stroke status after being found on the floor by her husband at 4 AM this morning. She was unable to speak and was not moving her right side. She was last seen well at 9 PM asked night. She was noted to be hypertensive with blood pressure of 200/117. CT scan of her head showed an acute 3.7 cm hemorrhage involving left basal ganglia with extension into the left lateral ventricle. There was a 5 mm left-to-right midline shift. Patient was nauseated and was given Zofran milligrams IV, in addition to 20 mg of labetalol for management of hypertensive urgency. NIH stroke score was 24. Patient was admitted to neuro intensive care unit.  Clinical Impression  Pt admitted with/for ICH at L BG with R sided plegia and decreased sensory.  Pt currently limited functionally due to the problems listed. ( See problems list.)   Pt will benefit from PT to maximize function and safety in order to get ready for next venue listed below.     Follow Up Recommendations CIR    Equipment Recommendations  Other (comment) (TBA next venue (s))    Recommendations for Other Services Rehab consult     Precautions / Restrictions Precautions Precautions: Fall      Mobility  Bed Mobility Overal bed mobility: Needs Assistance Bed Mobility: Rolling;Sidelying to Sit Rolling: Mod assist Sidelying to sit: +2 for physical assistance;Max assist       General bed mobility comments: difficulty coming up from R due to no R UE assist, little truncal coordination. pt w/shifted R to scoot L LE/hips forward incrementally, but unable to w/shift L to scoot R hips with significant assist  Transfers Overall transfer level: Needs assistance   Transfers: Sit to/from Stand;Stand  Pivot Transfers Sit to Stand: Max assist;+2 physical assistance;+2 safety/equipment Stand pivot transfers: Max assist;+2 physical assistance;+2 safety/equipment       General transfer comment: pt assisted forward, noted both LE assisting to power up and though pt needed significant R LE support, she activated R LE muscles to assist WBing  Ambulation/Gait                Stairs            Wheelchair Mobility    Modified Rankin (Stroke Patients Only) Modified Rankin (Stroke Patients Only) Pre-Morbid Rankin Score: No symptoms Modified Rankin: Severe disability     Balance Overall balance assessment: Needs assistance Sitting-balance support: No upper extremity supported;Single extremity supported;Bilateral upper extremity supported Sitting balance-Leahy Scale: Poor Sitting balance - Comments: sat EOB x10 min working on sitting balance in midline, truncal activation , w/shift with scooting, scanning R. Postural control: Right lateral lean                                   Pertinent Vitals/Pain Pain Assessment: Faces Faces Pain Scale: No hurt    Home Living Family/patient expects to be discharged to:: Private residence Living Arrangements: Spouse/significant other Available Help at Discharge: Family;Other (Comment) (Assist will have to be arranged as needed, husband works) Type of Home: House Home Access: Stairs to enter   Enter[employer] CorporationEntrance Stairs-Number of Steps: 1 Home Layout: One level Home Equipment: None Additional Comments: Independent, drove,  ran errands    Prior Function Level of Independence: Independent               Hand Dominance   Dominant Hand: Right    Extremity/Trunk Assessment   Upper Extremity Assessment:  (5/19 R arm brunstrom 1 and dimished absent LT)           Lower Extremity Assessment: RLE deficits/detail;LLE deficits/detail RLE Deficits / Details: Brunstrom 1, assoc. reactions, assisted with weight bearing during  transfer. LLE Deficits / Details: generally WFL,     Communication   Communication: Expressive difficulties  Cognition Arousal/Alertness: Awake/alert Behavior During Therapy: Flat affect (appears frustrated) Overall Cognitive Status: Difficult to assess                      General Comments General comments (skin integrity, edema, etc.): vitals maintain stable, Gaze preference to L, but with persistent cuing can rotate neck R and focus eyes R of midline on an object.    Exercises        Assessment/Plan    PT Assessment Patient needs continued PT services  PT Diagnosis Hemiplegia dominant side   PT Problem List Decreased strength;Decreased activity tolerance;Decreased balance;Decreased mobility;Decreased coordination;Decreased knowledge of use of DME;Decreased safety awareness;Impaired sensation  PT Treatment Interventions DME instruction;Functional mobility training;Therapeutic activities;Balance training;Neuromuscular re-education;Patient/family education   PT Goals (Current goals can be found in the Care Plan section) Acute Rehab PT Goals Patient Stated Goal: pt unable to participate, family wants rehab. PT Goal Formulation: With patient Time For Goal Achievement: 12/11/14 Potential to Achieve Goals: Good    Frequency Min 3X/week   Barriers to discharge        Co-evaluation               End of Session   Activity Tolerance: Patient tolerated treatment well;Patient limited by fatigue Patient left: in chair;with call bell/phone within reach;with family/visitor present Nurse Communication: Mobility status;Need for lift equipment         Time: 1205-1245 PT Time Calculation (min) (ACUTE ONLY): 40 min   Charges:   PT Evaluation $Initial PT Evaluation Tier I: 1 Procedure PT Treatments $Neuromuscular Re-education: 23-37 mins   PT G Codes:        Kathryn Khan, Kathryn GumKenneth Khan 11/27/2014, 1:13 PM 11/27/2014  Kathryn Khan, PT [phone]   (pager)

## 2014-11-27 NOTE — Progress Notes (Signed)
Rehab Admissions Coordinator Note:  Patient was screened by Clois DupesBoyette, Leviathan Macera Godwin for appropriateness for an Inpatient Acute Rehab Consult per PT recommendation.  At this time, we are recommending Inpatient Rehab consult.  Clois DupesBoyette, Delila Kuklinski Godwin 11/27/2014, 2:55 PM  I can be reached at [phone].

## 2014-11-27 NOTE — Evaluation (Signed)
Speech Language Pathology Evaluation Patient Details Name: Kathryn Khan MRN: [id number] DOB: 08/24/1945 Today's Date: 11/27/2014 Time: [id number] SLP Time Calculation (min) (ACUTE ONLY): 29 min  Problem List:  Patient Active Problem List   Diagnosis Date Noted  . ICH (intracerebral hemorrhage) 11/26/2014   Past Medical History:  Past Medical History  Diagnosis Date  . Hypertension    Past Surgical History:  Past Surgical History  Procedure Laterality Date  . Knee surgery    . Shoulder surgery     HPI:  69 y.o. female with history of HTN admitted for left BG hemorrhage with intraventricular extension   Assessment / Plan / Recommendation Clinical Impression  Pt has marked impairment with expressive communication at this time, with spontaneous verbal communication limited to "no" x1. With Max cues from SLP, she is able to repeat single syllables at a time in order to piece together her first and last name. Pt will need further assessment of alternative communcation methods (communication board, writing) when she has her glasses available. She responds to commands and yes/no questions accurately with relatively intact comprehension. Suspect a strong motor involvement in pt's expressive difficulties, with intermittent difficulties with initiating phonation. She will require additional treatment focused on differential diagnosis of expressive language tasks. Pt will benefit from CIR level therapies.    SLP Assessment  Patient needs continued Speech Lanaguage Pathology Services    Follow Up Recommendations  Inpatient Rehab;24 hour supervision/assistance    Frequency and Duration min 2x/week  2 weeks   Pertinent Vitals/Pain Pain Assessment: No/denies pain   SLP Goals  Patient/Family Stated Goal: pt unable to state Potential to Achieve Goals (ACUTE ONLY): Good Potential Considerations (ACUTE ONLY): Severity of impairments  SLP Evaluation Prior Functioning  Cognitive/Linguistic  Baseline: Within functional limits   Cognition  Overall Cognitive Status: Difficult to assess (aphasia/dysarthria) Arousal/Alertness: Awake/alert Orientation Level: Oriented to person;Oriented to place;Oriented to time Attention: Sustained Sustained Attention: Appears intact Awareness: Appears intact    Comprehension  Auditory Comprehension Overall Auditory Comprehension: Appears within functional limits for tasks assessed Reading Comprehension Reading Status: Unable to assess (comment) (needs her glasses)    Expression Expression Primary Mode of Expression: Nonverbal - gestures Verbal Expression Overall Verbal Expression: Impaired Initiation: Impaired Automatic Speech: Name (with Max cues) Level of Generative/Spontaneous Verbalization: Word Repetition: Impaired Level of Impairment: Word level Interfering Components: Other (comment) (motor speech) Effective Techniques: Other (Comment) (model/repetition) Non-Verbal Means of Communication: Gestures;Other (comment) (head nods; will attempt comm board when has glasses) Written Expression Dominant Hand: Right Written Expression:  (needs further assessment when glasses available)   Oral / Motor Oral Motor/Sensory Function Overall Oral Motor/Sensory Function: Impaired Labial ROM: Reduced right Labial Symmetry: Abnormal symmetry right Labial Strength: Reduced Labial Sensation: Reduced Lingual ROM: Within Functional Limits Lingual Symmetry: Within Functional Limits Lingual Strength: Reduced Facial ROM: Reduced right Facial Symmetry: Right droop Facial Strength: Reduced Facial Sensation: Reduced Mandible: Within Functional Limits Motor Speech Overall Motor Speech: Impaired Respiration: Within functional limits Phonation: Other (comment);Normal (intermittent difficulty with initiation) Articulation:  (limited assessment) Motor Planning: Impaired Level of Impairment: Word Motor Speech Errors: Groping for words     Maxcine HamLaura  Paiewonsky, M.A. CCC-SLP [phone]  [name], Luverta Korte 11/27/2014, 12:17 PM

## 2014-11-27 NOTE — Evaluation (Signed)
Clinical/Bedside Swallow Evaluation Patient Details  Name: Kathryn Khan MRN: [id number] Date of Birth: 08/13/1945  Today's Date: 11/27/2014 Time: SLP Start Time (ACUTE ONLY): 0950 SLP Stop Time (ACUTE ONLY): 1005 SLP Time Calculation (min) (ACUTE ONLY): 15 min  Past Medical History:  Past Medical History  Diagnosis Date  . Hypertension    Past Surgical History:  Past Surgical History  Procedure Laterality Date  . Knee surgery    . Shoulder surgery     HPI:  69 y.o. female with history of HTN admitted for left BG hemorrhage with intraventricular extension   Assessment / Plan / Recommendation Clinical Impression  Pt has right-sided lingual/labial weakness with anterior loss and pocketing noted. Immediate coughing with thin liquids is likely secondary to decreased oral control resulting in premature spillage into the laryngeal vestibule. No overt signs of aspiration are observed with nectar thick liquids or solid POs, although second swallows may be indicative of pharyngeal residuals. Recommend Dys 2 diet and nectar thick liquids with SLP f/u for tolerance and advancement.    Aspiration Risk  Mild    Diet Recommendation Dysphagia 2 (Fine chop);Nectar   Medication Administration: Crushed with puree Compensations: Slow rate;Small sips/bites;Check for pocketing;Check for anterior loss    Other  Recommendations Oral Care Recommendations: Oral care BID Other Recommendations: Order thickener from pharmacy;Prohibited food (jello, ice cream, thin soups);Remove water pitcher   Follow Up Recommendations       Frequency and Duration min 2x/week  2 weeks   Pertinent [facility]    SLP Swallow Goals     Swallow Study Prior Functional Status       General Date of Onset: 11/26/14 Other Pertinent Information: 69 y.o. female with history of HTN admitted for left BG hemorrhage with intraventricular extension Type of Study: Bedside swallow evaluation Previous Swallow  Assessment: none in chart Diet Prior to this Study: NPO Temperature Spikes Noted: Yes (99.7) Respiratory Status: Room air History of Recent Intubation: No Behavior/Cognition: Alert;Cooperative;Other (Comment) (aphasia/dysarthria) Oral Cavity - Dentition: Adequate natural dentition/normal for age Self-Feeding Abilities: Able to feed self;Needs assist Patient Positioning: Upright in bed Baseline Vocal Quality: Normal Volitional Cough: Cognitively unable to elicit    Oral/Motor/Sensory Function Overall Oral Motor/Sensory Function: Impaired Labial ROM: Reduced right Labial Symmetry: Abnormal symmetry right Labial Strength: Reduced Labial Sensation: Reduced Lingual ROM: Within Functional Limits Lingual Symmetry: Within Functional Limits Lingual Strength: Reduced Facial ROM: Reduced right Facial Symmetry: Right droop Facial Strength: Reduced Facial Sensation: Reduced Mandible: Within Functional Limits   Ice Chips Ice chips: Not tested   Thin Liquid Thin Liquid: Impaired Presentation: Cup;Self Fed;Straw Oral Phase Impairments: Reduced labial seal Oral Phase Functional Implications: Right anterior spillage Pharyngeal  Phase Impairments: Suspected delayed Swallow;Cough - Immediate    Nectar Thick Nectar Thick Liquid: Impaired Presentation: Cup;Self Fed;Straw Oral Phase Impairments: Reduced labial seal Oral phase functional implications: Right anterior spillage Pharyngeal Phase Impairments: Suspected delayed Swallow;Multiple swallows   Honey Thick Honey Thick Liquid: Not tested   Puree Puree: Impaired Presentation: Self Fed;Spoon Oral Phase Impairments: Reduced labial seal Oral Phase Functional Implications: Right anterior spillage Pharyngeal Phase Impairments: Multiple swallows   Solid       Solid: Impaired Presentation: Self Fed Oral Phase Impairments: Reduced labial seal;Reduced lingual movement/coordination;Impaired anterior to posterior transit Oral Phase Functional  Implications: Oral residue      Maxcine HamLaura Paiewonsky, M.A. CCC-SLP [phone]  [name], [name] 11/27/2014,11:19 AM

## 2014-11-27 NOTE — Progress Notes (Signed)
STROKE TEAM PROGRESS NOTE   SUBJECTIVE (INTERVAL HISTORY) Her family member are at the bedside.  Overall her condition is stable overnight. She is more awake alert and can repeat with answering some questions appropriately but severe dysarthria. Right hemiplegia. BP in good control under cleviplex.    OBJECTIVE Temp:  [98 F (36.7 C)-99.7 F (37.6 C)] 98.5 F (36.9 C) (05/19 0815) Pulse Rate:  [72-92] 83 (05/19 0845) Cardiac Rhythm:  [-] Normal sinus rhythm (05/19 0800) Resp:  [16-38] 20 (05/19 0845) BP: (100-167)/(50-97) 139/55 mmHg (05/19 0845) SpO2:  [92 %-98 %] 95 % (05/19 0845)   Recent Labs Lab 11/26/14 0536  GLUCAP 195*    Recent Labs Lab 11/26/14 0510 11/26/14 0517 11/26/14 1543  NA 132* 132* 133*  K 4.1 4.1  --   CL 98* 97*  --   CO2 22  --   --   GLUCOSE 183* 189*  --   BUN 14 17  --   CREATININE 0.66 0.60  --   CALCIUM 9.7  --   --     Recent Labs Lab 11/26/14 0510  AST 30  ALT 30  ALKPHOS 66  BILITOT 1.1  PROT 7.6  ALBUMIN 4.4    Recent Labs Lab 11/26/14 0510 11/26/14 0517  WBC 13.0*  --   NEUTROABS 11.7*  --   HGB 14.1 15.3*  HCT 40.1 45.0  MCV 97.1  --   PLT 209  --    No results for input(s): CKTOTAL, CKMB, CKMBINDEX, TROPONINI in the last 168 hours.  Recent Labs  11/26/14 0510  LABPROT 12.4  INR 0.92    Recent Labs  11/27/14 0240  COLORURINE AMBER*  LABSPEC 1.015  PHURINE 6.0  GLUCOSEU NEGATIVE  HGBUR MODERATE*  BILIRUBINUR NEGATIVE  KETONESUR 15*  PROTEINUR NEGATIVE  UROBILINOGEN 0.2  NITRITE NEGATIVE  LEUKOCYTESUR LARGE*       Component Value Date/Time   CHOL 226* 11/26/2014 1200   TRIG 47 11/26/2014 1200   HDL 78 11/26/2014 1200   CHOLHDL 2.9 11/26/2014 1200   VLDL 9 11/26/2014 1200   LDLCALC 139* 11/26/2014 1200   Lab Results  Component Value Date   HGBA1C 5.7* 11/26/2014   No results found for: LABOPIA, COCAINSCRNUR, LABBENZ, AMPHETMU, THCU, LABBARB  No results for input(s): ETH in the last 168  hours.  I have personally reviewed the radiological images below and agree with the radiology interpretations.  Ct Head (brain) Wo Contrast 11/26/14 - Unchanged left thalamic hemorrhage with intraventricular extension.  11/26/2014   IMPRESSION: Acute 3.7 cm bleed in the left basal ganglia with decompression into the ventricles, moderate volume of intraventricular blood. There is 5 mm left-to-right midline shift.     Chest Port 1 View  11/26/2014    IMPRESSION: Prominence of the pulmonary interstitial markings and probable small left pleural effusion. Without previous studies it is not possible no with these findings are acute or old. A PA and lateral chest x-ray with deep inspiratory effort would be useful.    2D Echocardiogram  pending  EKG  normal sinus rhythm. For complete results please see formal report.  PHYSICAL EXAM  Temp:  [98 F (36.7 C)-99.7 F (37.6 C)] 98.5 F (36.9 C) (05/19 0815) Pulse Rate:  [72-92] 83 (05/19 0845) Resp:  [16-38] 20 (05/19 0845) BP: (100-167)/(50-97) 139/55 mmHg (05/19 0845) SpO2:  [92 %-98 %] 95 % (05/19 0845)  General - Well nourished, well developed, eyes open, no acute distress.  Ophthalmologic - not cooperative  on exam.  Cardiovascular - Regular rate and rhythm with no murmur.  Neuro - more awake alert, able to repeat sentences but severe dysarthria. Able to answer some questions appropriately but dysarthria. Following simple commands both centrally and peripherally. Transcortical motor aphasia. PERRL, EOMI, inconsistently blinking to visual threat on the right, right facial droop, tongue in middle in mouth. Right UE 0/5 and RLE no spontaneous movement but 2+/5 on pain stimulation. LUE and LLE spontaneous movement. Right babinski positive. Reflex 1+. Sensation, coordination and gait not tested.   ASSESSMENT/PLAN Kathryn Khan is a 69 y.o. female with history of HTN admitted for left BG hemorrhage with intraventricular extension. Symptoms  stable but guarded.    Left BG hemorrhage with intraventricular extension - likely due to hypertensive bleed  CT showed left BG hemorrhage with ventricular extension  Repeat CT showed stable hematoma  2D Echo  pending  LDL 139, not at the goal  [id number] pending  SCDs  for VTE prophylaxis  Diet NPO time specified   no antithrombotic prior to admission, now on no antithrombotic  BP control with goal < 140  Ongoing aggressive stroke risk factor management  Therapy recommendations:  pending  Disposition:  Pending  Cerebral edema  Currently no need for hypertonic saline  Repeat CT this pm to evaluate midline shift  3% saline if needed  BP control  Full code  Close neuro check  Hypertension  Home meds:   lisinopril Currently on cleviprex drip BP goal < 140 Consider po meds once passed swallow  Stable  Patient counseled to be compliant with her blood pressure medications  Hyperlipidemia  Home meds:  none   Currently on none  LDL 139, goal < 70  Hold off statin now due to ICH and no po access  Continue statin at discharge  UTI  UA showed WBC too numerous to count  Put on cipro  Follow up urine cultures  On foley due to urinary retension  Other Stroke Risk Factors  Advanced age  ETOH use - 2-3 drinks daily  Other Active Problems  Hyponatremia - repeat this am  leukocytosis  Other Pertinent History  Not check BP at home  Hospital day # 1  This patient is critically ill due to ICH with ventricular extension and at significant risk of neurological worsening, death form hematoma extension, cerebral edema and brain herniation. This patient's care requires constant monitoring of vital signs, hemodynamics, respiratory and cardiac monitoring, review of multiple databases, neurological assessment, discussion with family, other specialists and medical decision making of high complexity. I spent 40 minutes of neurocritical care time in the care  of this patient.   Kathryn PlanJindong Weltha Cathy, MD PhD Stroke Neurology 11/27/2014 9:19 AM    To contact Stroke Continuity provider, please refer to [email or web address]. After hours, contact General Neurology

## 2014-11-27 NOTE — Progress Notes (Signed)
Echocardiogram 2D Echocardiogram has been performed.  Nolon RodBrown, Tony 11/27/2014, 9:27 AM

## 2014-11-27 NOTE — Consult Note (Signed)
Physical Medicine and Rehabilitation Consult Reason for Consult: Left basal ganglia hemorrhage Referring Physician: Dr.Xu   HPI: Kathryn NovakChantal A Canty is a 69 y.o. right handed female with history of hypertension. Admitted 11/26/2014 with right-sided weakness and aphasia after being found on the floor by her husband. Blood pressure 200/117. CT of the head showed acute 3.7 cm hemorrhage involving left basal ganglia with extension into the lateral left ventricle. There was a 5 mm left-to-right midline shift. Follow-up cranial CT scan unchanged. Patient did not receive TPA secondary to hemorrhage. Echocardiogram with ejection fraction of 60% grade 1 diastolic dysfunction. No source of emboli. Currently maintained on a dysphagia #2 nectar thick liquid diet. Close monitoring of blood pressure. Physical therapy evaluation completed 11/27/2014 with recommendations of physical medicine rehabilitation consult.  Patient was reportedly independent prior to admission but had some right-sided visual impairment. Per nursing has had urinary retention and Foley needed to be inserted today. Still on IV calcium channel blockers  Review of Systems  Unable to perform ROS: language   Past Medical History  Diagnosis Date  . Hypertension    Past Surgical History  Procedure Laterality Date  . Knee surgery    . Shoulder surgery     No family history on file. Social History:  reports that she quit smoking about 18 years ago. She has never used smokeless tobacco. She reports that she does not drink alcohol or use illicit drugs. Allergies:  Allergies  Allergen Reactions  . Penicillins Anaphylaxis  . Codeine Nausea And Vomiting  . Sulfa Antibiotics    Medications Prior to Admission  Medication Sig Dispense Refill  . atorvastatin (LIPITOR) 10 MG tablet Take 10 mg by mouth daily.    Marland Kitchen. lisinopril (PRINIVIL,ZESTRIL) 20 MG tablet Take 20 mg by mouth daily.    . metoprolol (LOPRESSOR) 50 MG tablet Take 50 mg  by mouth 2 (two) times daily.      Home: Home Living Family/patient expects to be discharged to:: Private residence Living Arrangements: Spouse/significant other Available Help at Discharge: Family, Other (Comment) (Assist will have to be arranged as needed, husband works) Type of Home: [facility] Home Access: Stairs to enter Secretary/administratorntrance Stairs-Number of Steps: 1 Home Layout: One level Home Equipment: None Additional Comments: Independent, drove, ran errands  Functional History: Prior Function Level of Independence: Independent Functional Status:  Mobility: Bed Mobility Overal bed mobility: Needs Assistance Bed Mobility: Rolling, Sidelying to Sit Rolling: Mod assist Sidelying to sit: +2 for physical assistance, Max assist General bed mobility comments: difficulty coming up from R due to no R UE assist, little truncal coordination. pt w/shifted R to scoot L LE/hips forward incrementally, but unable to w/shift L to scoot R hips with significant assist Transfers Overall transfer level: Needs assistance Transfers: Sit to/from Stand, Stand Pivot Transfers Sit to Stand: Max assist, +2 physical assistance, +2 safety/equipment Stand pivot transfers: Max assist, +2 physical assistance, +2 safety/equipment General transfer comment: pt assisted forward, noted both LE assisting to power up and though pt needed significant R LE support, she activated R LE muscles to assist WBing      ADL:    Cognition: Cognition Overall Cognitive Status: Difficult to assess Arousal/Alertness: Awake/alert Orientation Level: Oriented to person, Oriented to place, Oriented to time Attention: Sustained Sustained Attention: Appears intact Awareness: Appears intact Cognition Arousal/Alertness: Awake/alert Behavior During Therapy: Flat affect (appears frustrated) Overall Cognitive Status: Difficult to assess Difficult to assess due to: Impaired communication  Blood pressure 129/60, pulse 81,  temperature 98.7  F (37.1 C), temperature source Oral, resp. rate 19, height  (1.626 m), weight 72.7 kg (160 lb 4.4 oz), SpO2 96 %. Physical Exam  Vitals reviewed. HENT:  Head: Normocephalic.  Eyes: EOM are normal.  Neck: Normal range of motion. Neck supple. No thyromegaly present.  Cardiovascular: Normal rate and regular rhythm.   Respiratory: Effort normal and breath sounds normal. No respiratory distress.  GI: Soft. Bowel sounds are normal. She exhibits no distension.  Neurological: She is alert. A sensory deficit is present.  Patient is expressive receptive aphasic. She does not her head yes and no but not always consistent. She did not follow demonstrated are verbal commands for me during exam.  Motor strength is 4-5/5 in the left deltoid, biceps, triceps, grip, hip flexor, knee extensor, ankle dose flexor plantar flexor 0/5 in the right deltoid, biceps, triceps, grip, hip flexor, knee extensor, ankle dorsi flexor and plantar flexion  Patient withdraws to pinch in the left and right lower extremity Patient extends to pinch in the right upper extremity Able to perform two-step commands with the left upper extremity Patient makes some incomprehensible sounds, not able to nod yes or no Unable to follow complex commands.  Skin: Skin is warm and dry.    Results for orders placed or performed during the hospital encounter of 11/26/14 (from the past 24 hour(s))  Sodium     Status: Abnormal   Collection Time: 11/26/14  3:43 PM  Result Value Ref Range   Sodium 133 (L) 135 - 145 mmol/L  Urinalysis, Routine w reflex microscopic     Status: Abnormal   Collection Time: 11/27/14  2:40 AM  Result Value Ref Range   Color, Urine AMBER (A) YELLOW   APPearance TURBID (A) CLEAR   Specific Gravity, Urine 1.015 1.005 - 1.030   pH 6.0 5.0 - 8.0   Glucose, UA NEGATIVE NEGATIVE mg/dL   Hgb urine dipstick MODERATE (A) NEGATIVE   Bilirubin Urine NEGATIVE NEGATIVE   Ketones, ur 15 (A) NEGATIVE mg/dL   Protein,  ur NEGATIVE NEGATIVE mg/dL   Urobilinogen, UA 0.2 0.0 - 1.0 mg/dL   Nitrite NEGATIVE NEGATIVE   Leukocytes, UA LARGE (A) NEGATIVE  Urine microscopic-add on     Status: Abnormal   Collection Time: 11/27/14  2:40 AM  Result Value Ref Range   Squamous Epithelial / LPF RARE RARE   WBC, UA TOO NUMEROUS TO COUNT <3 WBC/hpf   RBC / HPF 7-10 <3 RBC/hpf   Bacteria, UA MANY (A) RARE  Basic metabolic panel     Status: Abnormal   Collection Time: 11/27/14 10:35 AM  Result Value Ref Range   Sodium 131 (L) 135 - 145 mmol/L   Potassium 3.1 (L) 3.5 - 5.1 mmol/L   Chloride 97 (L) 101 - 111 mmol/L   CO2 24 22 - 32 mmol/L   Glucose, Bld 173 (H) 65 - 99 mg/dL   BUN 6 6 - 20 mg/dL   Creatinine, Ser 5.40 0.44 - 1.00 mg/dL   Calcium 8.8 (L) 8.9 - 10.3 mg/dL   GFR calc non Af Amer >60 >60 mL/min   GFR calc Af Amer >60 >60 mL/min   Anion gap 10 5 - 15  CBC     Status: None   Collection Time: 11/27/14 10:35 AM  Result Value Ref Range   WBC 8.8 4.0 - 10.5 K/uL   RBC 4.01 3.87 - 5.11 MIL/uL   Hemoglobin 13.0 12.0 - 15.0 g/dL  HCT 39.2 36.0 - 46.0 %   MCV 97.8 78.0 - 100.0 fL   MCH 32.4 26.0 - 34.0 pg   MCHC 33.2 30.0 - 36.0 g/dL   RDW 16.112.8 09.611.5 - 04.515.5 %   Platelets 200 150 - 400 K/uL  Sodium, urine, random     Status: None   Collection Time: 11/27/14  1:54 PM  Result Value Ref Range   Sodium, Ur 183 mmol/L  Creatinine, urine, random     Status: None   Collection Time: 11/27/14  1:54 PM  Result Value Ref Range   Creatinine, Urine 51.33 mg/dL   Ct Head Wo Contrast  11/26/2014   CLINICAL DATA:  Follow-up intra cerebral hemorrhage.  EXAM: CT HEAD WITHOUT CONTRAST  TECHNIQUE: Contiguous axial images were obtained from the base of the skull through the vertex without intravenous contrast.  COMPARISON:  11/26/2014 0521 hr  FINDINGS: Acute hemorrhage centered in the left thalamus has not significantly changed in size, measuring 3.6 x 2.7 cm. Intraventricular extension is again seen with unchanged blood  in the left greater than right lateral ventricles and third ventricles. 5 mm of rightward midline shift is unchanged. Mild prominence of the ventricles is unchanged without definite evidence of hydrocephalus. There is no evidence of acute large territory infarct, new intracranial hemorrhage, or extra-axial fluid collection.  Orbits are unremarkable. Visualized mastoid air cells are clear. Small amount of frothy secretions in the right sphenoid sinus are unchanged.  IMPRESSION: Unchanged left thalamic hemorrhage with intraventricular extension.   Electronically Signed   By: Sebastian AcheAllen  Grady   On: 11/26/2014 17:00   Ct Head (brain) Wo Contrast  11/26/2014   CLINICAL DATA:  Code stroke.  Right-sided deficits.  Vomiting.  EXAM: CT HEAD WITHOUT CONTRAST  TECHNIQUE: Contiguous axial images were obtained from the base of the skull through the vertex without intravenous contrast.  COMPARISON:  None.  FINDINGS: There is an acute 3.7 x 1.9 cm left basal gangliar bleed, with decompression into the ventricles. Moderate amount of intraventricular blood dependently, left greater than right. There is 5 mm left-to-right midline shift. Mild ventricular prominence likely related to central atrophy, no frank hydrocephalus. The basilar cisterns remain patent. No subdural hemorrhage. Minimal inflammatory change in right side of sphenoid sinus. Calvarium is intact. The mastoid air cells are well aerated.  IMPRESSION: Acute 3.7 cm bleed in the left basal ganglia with decompression into the ventricles, moderate volume of intraventricular blood. There is 5 mm left-to-right midline shift.  Critical Value/emergent results were called by telephone at the time of interpretation on 11/26/2014 at 5:25 am to Dr. Roseanne RenoStewart , who verbally acknowledged these results.   Electronically Signed   By: Rubye OaksMelanie  Ehinger M.D.   On: 11/26/2014 05:26   Chest Port 1 View  11/26/2014   CLINICAL DATA:  Routine health maintenance  EXAM: PORTABLE CHEST - 1 VIEW   COMPARISON:  None.  FINDINGS: The lungs are adequately inflated. The interstitial markings are coarse bilaterally. The cardiac silhouette is mildly enlarged. The pulmonary vascularity is not engorged. A small amount of pleural fluid versus pleural thickening is present on the left. The bony structures exhibit no acute abnormalities.  IMPRESSION: Prominence of the pulmonary interstitial markings and probable small left pleural effusion. Without previous studies it is not possible no with these findings are acute or old. A PA and lateral chest x-ray with deep inspiratory effort would be useful.   Electronically Signed   By: David  SwazilandJordan M.D.   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 Does the need for close, 24 hr/day medical supervision in concert with the patient's rehab needs make it unreasonable for this patient to be served in a less intensive setting? Yes 2. Co-Morbidities requiring supervision/potential complications: Malignant hypertension, dysphagia 3. Due to bladder management, bowel management, safety, skin/wound care, disease management, medication administration, pain management and patient education, does the patient require 24 hr/day rehab nursing? Yes 4. Does the patient require coordinated care of a physician, rehab nurse, PT (1-2 hrs/day, 5 days/week), OT (1-2 hrs/day, 5 days/week) and SLP (0.5-1 hrs/day, 5 days/week) to address physical and functional deficits in the context of the above medical diagnosis(es)? Yes Addressing deficits in the following areas: balance, endurance, locomotion, strength, transferring, bowel/bladder control, bathing, dressing, feeding, grooming, toileting, cognition, speech, language, swallowing and psychosocial support 5. Can the patient actively participate in an intensive therapy program of at least 3 hrs of therapy per day at least 5 days  per week? Potentially 6. The potential for patient to make measurable gains while on inpatient rehab is good 7. Anticipated functional outcomes upon discharge from inpatient rehab are min assist  with PT, min assist with OT, min assist with SLP. 8. Estimated rehab length of stay to reach the above functional goals is: 20-25 days 9. Does the patient have adequate social supports and living environment to accommodate these discharge functional goals? Potentially 10. Anticipated D/C setting: Home 11. Anticipated post D/C treatments: HH therapy 12. Overall Rehab/Functional Prognosis: good  RECOMMENDATIONS: This patient's condition is appropriate for continued rehabilitative care in the following setting: CIR Patient has agreed to participate in recommended program. N/A Note that insurance prior authorization may be required for reimbursement for recommended care.  Comment: Needs to be off IV antihypertensive medications and able to sit up for 3 hours per day    11/27/2014

## 2014-11-28 DIAGNOSIS — E871 Hypo-osmolality and hyponatremia: Secondary | ICD-10-CM

## 2014-11-28 LAB — CBC
HEMATOCRIT: 36.5 % (ref 36.0–46.0)
Hemoglobin: 12.1 g/dL (ref 12.0–15.0)
MCH: 32.5 pg (ref 26.0–34.0)
MCHC: 33.2 g/dL (ref 30.0–36.0)
MCV: 98.1 fL (ref 78.0–100.0)
Platelets: 205 10*3/uL (ref 150–400)
RBC: 3.72 MIL/uL — ABNORMAL LOW (ref 3.87–5.11)
RDW: 12.8 % (ref 11.5–15.5)
WBC: 8.5 10*3/uL (ref 4.0–10.5)

## 2014-11-28 LAB — BASIC METABOLIC PANEL
Anion gap: 9 (ref 5–15)
BUN: 10 mg/dL (ref 6–20)
CO2: 24 mmol/L (ref 22–32)
CREATININE: 0.61 mg/dL (ref 0.44–1.00)
Calcium: 8.9 mg/dL (ref 8.9–10.3)
Chloride: 100 mmol/L — ABNORMAL LOW (ref 101–111)
GFR calc non Af Amer: >60 mL/min (ref >60)
Glucose, Bld: 137 mg/dL — ABNORMAL HIGH (ref 65–99)
POTASSIUM: 3.2 mmol/L — AB (ref 3.5–5.1)
Sodium: 133 mmol/L — ABNORMAL LOW (ref 135–145)

## 2014-11-28 MED ORDER — AMLODIPINE BESYLATE 10 MG PO TABS
10.0000 mg | ORAL_TABLET | Freq: Every day | ORAL | Status: DC
  Administered 2014-11-28 – 2014-11-30 (×3): 10 mg via ORAL
  Filled 2014-11-28 (×3): qty 1

## 2014-11-28 MED ORDER — LISINOPRIL 20 MG PO TABS
20.0000 mg | ORAL_TABLET | Freq: Two times a day (BID) | ORAL | Status: DC
  Administered 2014-11-28 – 2014-11-30 (×5): 20 mg via ORAL
  Filled 2014-11-28 (×7): qty 1

## 2014-11-28 MED ORDER — LABETALOL HCL 5 MG/ML IV SOLN
10.0000 mg | INTRAVENOUS | Status: DC | PRN
  Administered 2014-11-29: 10 mg via INTRAVENOUS
  Administered 2014-11-30 – 2014-12-03 (×4): 20 mg via INTRAVENOUS
  Administered 2014-12-04: 10 mg via INTRAVENOUS
  Administered 2014-12-05: 20 mg via INTRAVENOUS
  Administered 2014-12-06 (×2): 40 mg via INTRAVENOUS
  Administered 2014-12-06: 20 mg via INTRAVENOUS
  Administered 2014-12-06: 40 mg via INTRAVENOUS
  Administered 2014-12-07: 20 mg via INTRAVENOUS
  Administered 2014-12-07: 40 mg via INTRAVENOUS
  Administered 2014-12-07: 20 mg via INTRAVENOUS
  Filled 2014-11-28: qty 8
  Filled 2014-11-28 (×4): qty 4
  Filled 2014-11-28: qty 8
  Filled 2014-11-28 (×2): qty 4
  Filled 2014-11-28: qty 8
  Filled 2014-11-28 (×4): qty 4
  Filled 2014-11-28: qty 8
  Filled 2014-11-28: qty 4

## 2014-11-28 MED ORDER — RESOURCE THICKENUP CLEAR PO POWD
ORAL | Status: DC | PRN
  Filled 2014-11-28: qty 125

## 2014-11-28 MED ORDER — CIPROFLOXACIN HCL 500 MG PO TABS
500.0000 mg | ORAL_TABLET | Freq: Two times a day (BID) | ORAL | Status: DC
  Administered 2014-11-28 – 2014-11-29 (×3): 500 mg via ORAL
  Filled 2014-11-28 (×5): qty 1

## 2014-11-28 MED ORDER — POTASSIUM CHLORIDE 10 MEQ/100ML IV SOLN
10.0000 meq | INTRAVENOUS | Status: AC
  Administered 2014-11-28 (×4): 10 meq via INTRAVENOUS
  Filled 2014-11-28 (×4): qty 100

## 2014-11-28 MED ORDER — SODIUM CHLORIDE 1 G PO TABS
2.0000 g | ORAL_TABLET | Freq: Three times a day (TID) | ORAL | Status: DC
  Administered 2014-11-28 – 2014-11-30 (×6): 2 g via ORAL
  Filled 2014-11-28 (×12): qty 2

## 2014-11-28 NOTE — Progress Notes (Signed)
Rehab admissions - Evaluated for possible admission.  I met with patient, husband and 3 daughters at the bedside.  Family want inpatient rehab admission.  One of the daughters will be caring for patient primarily, others to support.  I will begin authorization process with [facility] medicare.  Patient not medically ready yet for inpatient rehab admission.  I will follow up again on Monday.  Call me for questions.  #[phone]

## 2014-11-28 NOTE — Progress Notes (Signed)
Speech Language Pathology Treatment: Dysphagia;Cognitive-Linquistic  Patient Details Name: Kathryn Khan MRN: [id number] DOB: 05/30/1946 Today's Date: 11/28/2014 Time: 1351-1400 SLP Time Calculation (min) (ACUTE ONLY): 9 min  Assessment / Plan / Recommendation Clinical Impression  Pt seen briefly for dysphagia and aphasia (just completed lunch and attempting to take a nap).  Pt consumed consecutive sips of nectar V-8 juice via straw. Pt and family educated to use caution with straw and smaller sips with clinical reasoning provided. No s/s oropharyngeal difficulty.  She required mod-max verbal and visual cues during automatic tasks (counting). Followed functional simple commands during activity with min verbal assist. Self corrected x 1 with yes/no response. Family educated WU:[id number]:communication with pt (extra processing time, have pt look at speakers mouth etc)   HPI Other Pertinent Information: 69 y.o. female with history of HTN admitted for left BG hemorrhage with intraventricular extension   Pertinent Vitals Pain Assessment: No/denies pain Faces Pain Scale: No hurt  SLP Plan  Continue with current plan of care    Recommendations Diet recommendations: Dysphagia 2 (fine chop);Nectar-thick liquid Liquids provided via: Cup;Straw Medication Administration: Whole meds with puree Supervision: Patient able to self feed;Full supervision/cueing for compensatory strategies;Staff to assist with self feeding Compensations: Slow rate;Small sips/bites;Check for pocketing;Check for anterior loss Postural Changes and/or Swallow Maneuvers: Seated upright 90 degrees              General recommendations: Rehab consult Oral Care Recommendations: Oral care BID Follow up Recommendations: Inpatient Rehab Plan: Continue with current plan of care    GO     Kathryn Khan, Kathryn Khan 11/28/2014, 2:13 PM   Kathryn CoonsLisa Khan Lonell FaceLitaker M.Ed [employer]-SLP Pager [phone]

## 2014-11-28 NOTE — Progress Notes (Signed)
STROKE TEAM PROGRESS NOTE   SUBJECTIVE (INTERVAL HISTORY) Her husband is at the bedside.  Overall her condition is stable overnight. She is more awake alert and repeat part of sentences and answer with one word, still severe dysarthria. Right hemiplegia. BP in good control. Off cleviprex and on po meds.   OBJECTIVE Temp:  [98 F (36.7 C)-98.8 F (37.1 C)] 98.1 F (36.7 C) (05/20 1200) Pulse Rate:  [61-92] 71 (05/20 1300) Cardiac Rhythm:  [-] Normal sinus rhythm (05/20 0800) Resp:  [16-25] 18 (05/20 1300) BP: (115-161)/(55-78) 153/75 mmHg (05/20 1300) SpO2:  [91 %-99 %] 96 % (05/20 1300)   Recent Labs Lab 11/26/14 0536  GLUCAP 195*    Recent Labs Lab 11/26/14 0510 11/26/14 0517 11/26/14 1543 11/27/14 1035 11/28/14 0215  NA 132* 132* 133* 131* 133*  K 4.1 4.1  --  3.1* 3.2*  CL 98* 97*  --  97* 100*  CO2 22  --   --  24 24  GLUCOSE 183* 189*  --  173* 137*  BUN 14 17  --  6 10  CREATININE 0.66 0.60  --  0.48 0.61  CALCIUM 9.7  --   --  8.8* 8.9    Recent Labs Lab 11/26/14 0510  AST 30  ALT 30  ALKPHOS 66  BILITOT 1.1  PROT 7.6  ALBUMIN 4.4    Recent Labs Lab 11/26/14 0510 11/26/14 0517 11/27/14 1035 11/28/14 0215  WBC 13.0*  --  8.8 8.5  NEUTROABS 11.7*  --   --   --   HGB 14.1 15.3* 13.0 12.1  HCT 40.1 45.0 39.2 36.5  MCV 97.1  --  97.8 98.1  PLT 209  --  200 205   No results for input(s): CKTOTAL, CKMB, CKMBINDEX, TROPONINI in the last 168 hours.  Recent Labs  11/26/14 0510  LABPROT 12.4  INR 0.92    Recent Labs  11/27/14 0240  COLORURINE AMBER*  LABSPEC 1.015  PHURINE 6.0  GLUCOSEU NEGATIVE  HGBUR MODERATE*  BILIRUBINUR NEGATIVE  KETONESUR 15*  PROTEINUR NEGATIVE  UROBILINOGEN 0.2  NITRITE NEGATIVE  LEUKOCYTESUR LARGE*       Component Value Date/Time   CHOL 226* 11/26/2014 1200   TRIG 47 11/26/2014 1200   HDL 78 11/26/2014 1200   CHOLHDL 2.9 11/26/2014 1200   VLDL 9 11/26/2014 1200   LDLCALC 139* 11/26/2014 1200   Lab  Results  Component Value Date   HGBA1C 5.7* 11/26/2014   No results found for: LABOPIA, COCAINSCRNUR, LABBENZ, AMPHETMU, THCU, LABBARB  No results for input(s): ETH in the last 168 hours.  I have personally reviewed the radiological images below and agree with the radiology interpretations.  Ct Head (brain) Wo Contrast 11/26/14 - Unchanged left thalamic hemorrhage with intraventricular extension.  11/26/2014   IMPRESSION: Acute 3.7 cm bleed in the left basal ganglia with decompression into the ventricles, moderate volume of intraventricular blood. There is 5 mm left-to-right midline shift.     Chest Port 1 View  11/26/2014    IMPRESSION: Prominence of the pulmonary interstitial markings and probable small left pleural effusion. Without previous studies it is not possible no with these findings are acute or old. A PA and lateral chest x-ray with deep inspiratory effort would be useful.    2D Echocardiogram  Left ventricle: The cavity size was normal. There was moderate concentric hypertrophy. Systolic function was normal. The estimated ejection fraction was in the range of 55% to 60%. Wall motion was normal; there were  no regional wall motion abnormalities. Doppler parameters are consistent with abnormal left ventricular relaxation (grade 1 diastolic dysfunction). - Mitral valve: Mildly to moderately calcified annulus. - Left atrium: The atrium was moderately dilated.  Impressions: - No cardiac source of emboli was indentified.   EKG  normal sinus rhythm. For complete results please see formal report.  PHYSICAL EXAM  Temp:  [98 F (36.7 C)-98.8 F (37.1 C)] 98.1 F (36.7 C) (05/20 1200) Pulse Rate:  [61-92] 71 (05/20 1300) Resp:  [16-25] 18 (05/20 1300) BP: (115-161)/(55-78) 153/75 mmHg (05/20 1300) SpO2:  [91 %-99 %] 96 % (05/20 1300)  General - Well nourished, well developed, eyes open, no acute distress.  Ophthalmologic - not cooperative on  exam.  Cardiovascular - Regular rate and rhythm with no murmur.  Neuro - more awake alert, able to repeat part of the sentences and answer questions with one word. Orientated to age, month and people, but not to year. Following simple commands both centrally and peripherally. Transcortical motor aphasia. PERRL, EOMI, inconsistently blinking to visual threat on the right, right facial droop, tongue in middle in mouth. Right UE 0/5 and RLE no spontaneous movement but 2+/5 on pain stimulation. LUE and LLE spontaneous movement. Right babinski positive. Reflex 1+. Sensation, coordination and gait not tested.   ASSESSMENT/PLAN [name] is a 69 y.o. female with history of HTN admitted for left BG hemorrhage with intraventricular extension. Symptoms stable but guarded.    Left BG hemorrhage with intraventricular extension - likely due to hypertensive bleed  CT showed left BG hemorrhage with ventricular extension  Repeat CT showed stable hematoma  2D Echo  unremarkable  LDL 139, not at the goal  [id number] 5.7  SCDs  for VTE prophylaxis  DIET DYS 2 Room service appropriate?: Yes; Fluid consistency:: Nectar Thick   no antithrombotic prior to admission, now on no antithrombotic  BP control with goal < 160  Ongoing aggressive stroke risk factor management  Therapy recommendations:  CIR  Disposition:  Pending  Hyponatremia  Na 131->132->133  Urine Na 180, consistent with salt wasting syndrome  On oral salt tablet  Increase oral salt to 2g tid  Close monitoring  Hypertension  Home meds:   Lisinopril, metoprolol Currently on amlodipine and lisinopril BP goal < 160  Stable  Patient counseled to be compliant with her blood pressure medications  Hyperlipidemia  Home meds:  none   Currently on none  LDL 139, goal < 70  Hold off statin now due to ICH  Continue statin at discharge  UTI  UA showed WBC too numerous to count  Put on cipro  urine cultures  pending  On foley due to urinary retension  Other Stroke Risk Factors  Advanced age  ETOH use - 2-3 drinks daily  Other Active Problems  leukocytosis  Other Pertinent History  Not check BP at home  Hospital day # 2  This patient is critically ill due to ICH with ventricular extension and at significant risk of neurological worsening, death form hematoma extension, cerebral edema and brain herniation. This patient's care requires constant monitoring of vital signs, hemodynamics, respiratory and cardiac monitoring, review of multiple databases, neurological assessment, discussion with family, other specialists and medical decision making of high complexity. I spent 35 minutes of neurocritical care time in the care of this patient.   Marvel PlanJindong Norinne Jeane, MD PhD Stroke Neurology 11/28/2014 2:25 PM    To contact Stroke Continuity provider, please refer to [email or web address]. After hours, contact General  Neurology

## 2014-11-28 NOTE — Progress Notes (Signed)
Physical Therapy Treatment Patient Details Name: Kathryn NovakChantal A Khan MRN: [id number] DOB: 03/11/1946 Today's Date: 11/28/2014    History of Present Illness 69 y.o. female with a history of hypertension. CT scan of her head showed an acute 3.7 cm hemorrhage involving left basal ganglia with extension into the left lateral ventricle. There was a 5 mm left-to-right midline shift. NIH stroke score was 24.     PT Comments    Patient with some progress and improvements with sitting balance and activity tolerance. Overall remains limited with mobility and requires increased +2 assist. Will continue to see and progress as tolerated.  Follow Up Recommendations  CIR     Equipment Recommendations  Other (comment) (TBA next venue (s))    Recommendations for Other Services Rehab consult     Precautions / Restrictions Precautions Precautions: Fall Restrictions Weight Bearing Restrictions: No    Mobility  Bed Mobility Overal bed mobility: Needs Assistance Bed Mobility: Rolling;Sidelying to Sit Rolling: Mod assist Sidelying to sit: +2 for physical assistance;Max assist       General bed mobility comments: Used hand over hand to facilitate movement patterns.   Transfers Overall transfer level: Needs assistance Equipment used:  (two person wrap around 3 musketeer technique) Transfers: Sit to/from UGI CorporationStand;Stand Pivot Transfers Sit to Stand: Max assist;+2 physical assistance;+2 safety/equipment Stand pivot transfers: Max assist;+2 physical assistance;+2 safety/equipment       General transfer comment: Sat EOB working on sitting balance in midline, truncal activation and control. Facilitation with opposing lateral lean and UE support. Time spent working on Right side attention as well as cognition with functional tasks.  Ambulation/Gait                 Stairs            Wheelchair Mobility    Modified Rankin (Stroke Patients Only) Modified Rankin (Stroke Patients  Only) Pre-Morbid Rankin Score: No symptoms Modified Rankin: Severe disability     Balance     Sitting balance-Leahy Scale: Poor Sitting balance - Comments: R bias; Able to achieve midline orientation, however unable to maintain without support; Pt leans R when distracted Postural control: Right lateral lean                          Cognition Arousal/Alertness: Awake/alert Behavior During Therapy: Flat affect (appears frustrated) Overall Cognitive Status: Impaired/Different from baseline Area of Impairment: Attention;Awareness;Problem solving   Current Attention Level: Sustained   Following Commands: Follows one step commands inconsistently   Awareness: Intellectual Problem Solving: Slow processing;Difficulty sequencing General Comments: Attention affecting postural control    Exercises      General Comments General comments (skin integrity, edema, etc.): educated family regarding gentle ROM BLEs, also explained goals of focus for acute therapies and progression plan at this time. Family appreciative and receptive.       Pertinent Vitals/Pain Faces Pain Scale: No hurt    Home Living Family/patient expects to be discharged to:: Private residence Living Arrangements: Spouse/significant other Available Help at Discharge: Family;Other (Comment) (Assist will have to be arranged as needed, husband works) Type of Home: House Home Access: Stairs to enter   Home Layout: One level Home Equipment: None Additional Comments: Independent, drove, ran errands    Prior Function Level of Independence: Independent          PT Goals (current goals can now be found in the care plan section) Acute Rehab PT Goals Patient Stated Goal: pt unable to  participate, family wants rehab. PT Goal Formulation: With patient Time For Goal Achievement: 12/11/14 Potential to Achieve Goals: Good Progress towards PT goals: Progressing toward goals    Frequency  Min 3X/week    PT  Plan Current plan remains appropriate    Co-evaluation PT/OT/SLP Co-Evaluation/Treatment: Yes Reason for Co-Treatment: For patient/therapist safety;Complexity of the patient's impairments (multi-system involvement) PT goals addressed during session: Mobility/safety with mobility OT goals addressed during session: ADL's and self-care;Other (comment);Strengthening/ROM (mobility)     End of Session Equipment Utilized During Treatment: Gait belt Activity Tolerance: Patient tolerated treatment well;Patient limited by fatigue Patient left: in chair;with call bell/phone within reach;with family/visitor present     Time: [id number] PT Time Calculation (min) (ACUTE ONLY): 22 min  Charges:  $Neuromuscular Re-education: 8-22 mins                    G CodesFabio Asa:      Hassani Sliney J 11/28/2014, 1:16 PM Charlotte Crumbevon Robbi Spells, PT DPT  [phone]

## 2014-11-28 NOTE — Progress Notes (Signed)
Occupational Therapy Evaluation Patient Details Name: Kathryn NovakChantal A Crigger MRN: [id number] DOB: 10/11/1945 Today's Date: 11/28/2014    History of Present Illness 69 y.o. female with a history of hypertension. CT scan of her head showed an acute 3.7 cm hemorrhage involving left basal ganglia with extension into the left lateral ventricle. There was a 5 mm left-to-right midline shift. NIH stroke score was 24.    Clinical Impression   PTA, pt independent with mobility and ADL. Pt with significant functional decline and requires +2 Max A with mobility and Max A with ADL due to below deficits. Pt with a very supportive family and is an excellent CIR candidate to maximize independence with ADL and functional mobility to D/C home with 24/7 A from family. Will follow acutely to address established goals. .     Follow Up Recommendations  CIR;Supervision/Assistance - 24 hour    Equipment Recommendations  3 in 1 bedside comode;Tub/shower bench    Recommendations for Other Services Rehab consult     Precautions / Restrictions Precautions Precautions: Fall Restrictions Weight Bearing Restrictions: No      Mobility Bed Mobility Overal bed mobility: Needs Assistance Bed Mobility: Rolling;Sidelying to Sit Rolling: Mod assist Sidelying to sit: +2 for physical assistance;Max assist       General bed mobility comments: Used hand over hand to facilitate movement patterns.   Transfers Overall transfer level: Needs assistance Equipment used:  (two person wrap around 3 musketeer technique) Transfers: Sit to/from [employer] CorporationStand;Stand Pivot Transfers Sit to Stand: Max assist;+2 physical assistance;+2 safety/equipment Stand pivot transfers: Max assist;+2 physical assistance;+2 safety/equipment       General transfer comment: Sat EOB working on sitting balance in midline, truncal activation and control. Facilitation with opposing lateral lean and UE support. Time spent working on Right side attention as  well as cognition with functional tasks.    Balance     Sitting balance-Leahy Scale: Poor Sitting balance - Comments: R bias; Able to achieve midline orientation, however unable to maintain without support; Pt leans R when distracted Postural control: Right lateral lean                                  ADL Overall ADL's : Needs assistance/impaired Eating/Feeding: Maximal assistance (dysphagia 2/nectar)   Grooming: Wash/dry face;Moderate assistance Grooming Details (indicate cue type and reason): able to bring cloth to face on command Upper Body Bathing: Moderate assistance   Lower Body Bathing: Maximal assistance;Bed level   Upper Body Dressing : Maximal assistance;Sitting   Lower Body Dressing: Maximal assistance   Toilet Transfer: +2 for physical assistance;Maximal assistance (used 3 musketeer approach) StatisticianToilet Transfer Details (indicate cue type and reason): simulated Toileting- Clothing Manipulation and Hygiene: Total assistance (foley)       Functional mobility during ADLs: +2 for physical assistance;Maximal assistance General ADL Comments: Noted R inattention during ADL task. Educated family on sitting on R and helping pt begin to complete simple ADL tasks on her own; educated family on need to keep RUE supported on pillow     Vision Vision Assessment?: Vision impaired- to be further tested in functional context   Perception     Praxis Praxis Praxis tested?: Deficits Deficits: Perseveration    Pertinent Vitals/Pain Faces Pain Scale: No hurt     Hand Dominance Right   Extremity/Trunk Assessment Upper Extremity Assessment Upper Extremity Assessment: RUE deficits/detail RUE Deficits / Details: Flaccid; no active movement noted; PROM WFL;  hx of R shoulder RTC repair RUE Sensation: decreased light touch RUE Coordination: decreased fine motor;decreased gross motor   Lower Extremity Assessment Lower Extremity Assessment: Defer to PT evaluation    Cervical / Trunk Assessment Cervical / Trunk Assessment: Other exceptions Cervical / Trunk Exceptions: R bias   Communication Communication Communication: Expressive difficulties   Cognition Arousal/Alertness: Awake/alert Behavior During Therapy: Flat affect (appears frustrated) Overall Cognitive Status: Impaired/Different from baseline Area of Impairment: Attention;Awareness;Problem solving   Current Attention Level: Sustained   Following Commands: Follows one step commands inconsistently   Awareness: Intellectual Problem Solving: Slow processing;Difficulty sequencing General Comments: Attention affecting postural control   General Comments       Exercises       Shoulder Instructions      Home Living Family/patient expects to be discharged to:: Private residence Living Arrangements: Spouse/significant other Available Help at Discharge: Family;Other (Comment) (Assist will have to be arranged as needed, husband works) Type of Home: House Home Access: Stairs to enter Entergy CorporationEntrance Stairs-Number of Steps: 1   Home Layout: One level               Home Equipment: None   Additional Comments: Independent, drove, ran errands      Prior Functioning/Environment Level of Independence: Independent             OT Diagnosis: Generalized weakness;Cognitive deficits;Disturbance of vision;Hemiplegia dominant side   OT Problem List: Decreased strength;Decreased range of motion;Decreased activity tolerance;Impaired balance (sitting and/or standing);Impaired vision/perception;Decreased coordination;Decreased cognition;Decreased safety awareness;Decreased knowledge of use of DME or AE;Decreased knowledge of precautions;Impaired sensation;Impaired tone;Impaired UE functional use   OT Treatment/Interventions: Self-care/ADL training;Therapeutic exercise;Neuromuscular education;DME and/or AE instruction;Therapeutic activities;Cognitive remediation/compensation;Visual/perceptual  remediation/compensation;Patient/family education;Balance training    OT Goals(Current goals can be found in the care plan section) Acute Rehab OT Goals Patient Stated Goal: pt unable to participate, family wants rehab. OT Goal Formulation: With patient/family Time For Goal Achievement:  Potential to Achieve Goals: Good  OT Frequency: Min 3X/week   Barriers to D/C:            Co-evaluation PT/OT/SLP Co-Evaluation/Treatment: Yes (partial session) Reason for Co-Treatment: For patient/therapist safety;Complexity of the patient's impairments (multi-system involvement)   OT goals addressed during session: ADL's and self-care;Other (comment);Strengthening/ROM (mobility)      End of Session Equipment Utilized During Treatment: Gait belt Nurse Communication: Mobility status;Need for lift equipment  Activity Tolerance: Patient tolerated treatment well Patient left: in chair;with call bell/phone within reach;with family/visitor present   Time: [id number] OT Time Calculation (min): 26 min Charges:  OT General Charges $OT Visit: 1 Procedure OT Evaluation $Initial OT Evaluation Tier I: 1 Procedure OT Treatments $Self Care/Home Management : 8-22 mins G-Codes:    Sherrel Ploch,HILLARY 11/28/2014, 12:51 PM   [facility] LLCilary Samyuktha Brau, OTR/L  [phone] 11/28/2014

## 2014-11-28 NOTE — Progress Notes (Signed)
Pt arrived to 4N29. Alert and oriented x4. Tele box 4N28 placed on pt. Pt oriented to room. Call bell within reach. Will continue to monitor.

## 2014-11-29 LAB — BASIC METABOLIC PANEL
Anion gap: 6 (ref 5–15)
BUN: 11 mg/dL (ref 6–20)
CHLORIDE: 104 mmol/L (ref 101–111)
CO2: 25 mmol/L (ref 22–32)
Calcium: 8.8 mg/dL — ABNORMAL LOW (ref 8.9–10.3)
Creatinine, Ser: 0.54 mg/dL (ref 0.44–1.00)
GFR calc Af Amer: >60 mL/min (ref >60)
GLUCOSE: 149 mg/dL — AB (ref 65–99)
POTASSIUM: 3.7 mmol/L (ref 3.5–5.1)
Sodium: 135 mmol/L (ref 135–145)

## 2014-11-29 LAB — CBC
HCT: 37.4 % (ref 36.0–46.0)
HEMOGLOBIN: 12.2 g/dL (ref 12.0–15.0)
MCH: 32.2 pg (ref 26.0–34.0)
MCHC: 32.6 g/dL (ref 30.0–36.0)
MCV: 98.7 fL (ref 78.0–100.0)
Platelets: 189 10*3/uL (ref 150–400)
RBC: 3.79 MIL/uL — ABNORMAL LOW (ref 3.87–5.11)
RDW: 12.8 % (ref 11.5–15.5)
WBC: 7.8 10*3/uL (ref 4.0–10.5)

## 2014-11-29 LAB — URINALYSIS W MICROSCOPIC (NOT AT ARMC)
Bilirubin Urine: NEGATIVE
GLUCOSE, UA: NEGATIVE mg/dL
Ketones, ur: NEGATIVE mg/dL
Nitrite: NEGATIVE
PH: 6 (ref 5.0–8.0)
Protein, ur: NEGATIVE mg/dL
Specific Gravity, Urine: 1.011 (ref 1.005–1.030)
UROBILINOGEN UA: 0.2 mg/dL (ref 0.0–1.0)

## 2014-11-29 LAB — URINE CULTURE

## 2014-11-29 LAB — CLOSTRIDIUM DIFFICILE BY PCR: Toxigenic C. Difficile by PCR: NEGATIVE

## 2014-11-29 MED ORDER — METOPROLOL TARTRATE 25 MG PO TABS
25.0000 mg | ORAL_TABLET | Freq: Two times a day (BID) | ORAL | Status: DC
  Administered 2014-11-29 (×2): 25 mg via ORAL
  Filled 2014-11-29 (×2): qty 1

## 2014-11-29 MED ORDER — ATORVASTATIN CALCIUM 20 MG PO TABS
20.0000 mg | ORAL_TABLET | Freq: Every day | ORAL | Status: DC
  Administered 2014-11-29: 20 mg via ORAL
  Filled 2014-11-29: qty 2
  Filled 2014-11-29: qty 1

## 2014-11-29 MED ORDER — HEPARIN SODIUM (PORCINE) 5000 UNIT/ML IJ SOLN
5000.0000 [IU] | Freq: Three times a day (TID) | INTRAMUSCULAR | Status: DC
  Administered 2014-11-29 – 2014-11-30 (×3): 5000 [IU] via SUBCUTANEOUS
  Filled 2014-11-29 (×3): qty 1

## 2014-11-29 NOTE — Progress Notes (Signed)
STROKE TEAM PROGRESS NOTE   SUBJECTIVE (INTERVAL HISTORY) Her daughters are at the bedside.  Overall her condition is stable overnight. Her BP still fluctuates and high this am. will add metoprolol. 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 Urine culture showed E Coli and sensitive to cipro. Will repeat CT head in am.   OBJECTIVE Temp:  [98 F (36.7 C)-98.9 F (37.2 C)] 98.4 F (36.9 C) (05/21 1406) Pulse Rate:  [71-84] 84 (05/21 1406) Cardiac Rhythm:  [-] Normal sinus rhythm (05/20 2156) Resp:  [16-22] 20 (05/21 1406) BP: (151-183)/(74-93) 172/77 mmHg (05/21 1406) SpO2:  [94 %-100 %] 95 % (05/21 1406)   Recent Labs Lab 11/26/14 0536  GLUCAP 195*    Recent Labs Lab 11/26/14 0510 11/26/14 0517 11/26/14 1543 11/27/14 1035 11/28/14 0215 11/29/14 0448  NA 132* 132* 133* 131* 133* 135  K 4.1 4.1  --  3.1* 3.2* 3.7  CL 98* 97*  --  97* 100* 104  CO2 22  --   --  24 24 25   GLUCOSE 183* 189*  --  173* 137* 149*  BUN 14 17  --  6 10 11   CREATININE 0.66 0.60  --  0.48 0.61 0.54  CALCIUM 9.7  --   --  8.8* 8.9 8.8*    Recent Labs Lab 11/26/14 0510  AST 30  ALT 30  ALKPHOS 66  BILITOT 1.1  PROT 7.6  ALBUMIN 4.4    Recent Labs Lab 11/26/14 0510 11/26/14 0517 11/27/14 1035 11/28/14 0215 11/29/14 0448  WBC 13.0*  --  8.8 8.5 7.8  NEUTROABS 11.7*  --   --   --   --   HGB 14.1 15.3* 13.0 12.1 12.2  HCT 40.1 45.0 39.2 36.5 37.4  MCV 97.1  --  97.8 98.1 98.7  PLT 209  --  200 205 189   No results for input(s): CKTOTAL, CKMB, CKMBINDEX, TROPONINI in the last 168 hours. No results for input(s): LABPROT, INR in the last 72 hours.  Recent Labs  11/27/14 0240  COLORURINE AMBER*  LABSPEC 1.015  PHURINE 6.0  GLUCOSEU NEGATIVE  HGBUR MODERATE*  BILIRUBINUR NEGATIVE  KETONESUR 15*  PROTEINUR NEGATIVE  UROBILINOGEN 0.2  NITRITE NEGATIVE  LEUKOCYTESUR LARGE*       Component Value Date/Time   CHOL 226* 11/26/2014 1200   TRIG 47  11/26/2014 1200   HDL 78 11/26/2014 1200   CHOLHDL 2.9 11/26/2014 1200   VLDL 9 11/26/2014 1200   LDLCALC 139* 11/26/2014 1200   Lab Results  Component Value Date   HGBA1C 5.7* 11/26/2014   No results found for: LABOPIA, COCAINSCRNUR, LABBENZ, AMPHETMU, THCU, LABBARB  No results for input(s): ETH in the last 168 hours.  I have personally reviewed the radiological images below and agree with the radiology interpretations.  Ct Head (brain) Wo Contrast 11/26/14 - Unchanged left thalamic hemorrhage with intraventricular extension.  11/26/2014   IMPRESSION: Acute 3.7 cm bleed in the left basal ganglia with decompression into the ventricles, moderate volume of intraventricular blood. There is 5 mm left-to-right midline shift.     Chest Port 1 View  11/26/2014    IMPRESSION: Prominence of the pulmonary interstitial markings and probable small left pleural effusion. Without previous studies it is not possible no with these findings are acute or old. A PA and lateral chest x-ray with deep inspiratory effort would be useful.    2D Echocardiogram  Left ventricle: The cavity size was normal. There was moderate concentric hypertrophy. Systolic  function was normal. The estimated ejection fraction was in the range of 55% to 60%. Wall motion was normal; there were no regional wall motion abnormalities. Doppler parameters are consistent with abnormal left ventricular relaxation (grade 1 diastolic dysfunction). - Mitral valve: Mildly to moderately calcified annulus. - Left atrium: The atrium was moderately dilated. Impressions: - No cardiac source of emboli was indentified.  EKG  normal sinus rhythm. For complete results please see formal report.  PHYSICAL EXAM  Temp:  [98 F (36.7 C)-98.9 F (37.2 C)] 98.4 F (36.9 C) (05/21 1406) Pulse Rate:  [71-84] 84 (05/21 1406) Resp:  [16-22] 20 (05/21 1406) BP: (151-183)/(74-93) 172/77 mmHg (05/21 1406) SpO2:  [94 %-100 %] 95 % (05/21  1406)  General - Well nourished, well developed, eyes open, no acute distress.  Ophthalmologic - not cooperative on exam.  Cardiovascular - Regular rate and rhythm with no murmur.  Neuro - more awake alert, able to repeat part of the sentences and answer questions with one word. Orientated to age, month and people, but not to year. Following simple commands both centrally and peripherally. Transcortical motor aphasia. PERRL, EOMI, inconsistently blinking to visual threat on the right, right facial droop, tongue in middle in mouth. Right UE 0/5 and RLE no spontaneous movement but 2+/5 on pain stimulation. LUE and LLE spontaneous movement. Right babinski positive. Reflex 1+. Sensation, coordination and gait not tested.   ASSESSMENT/PLAN Ms. EDWIN CHERIAN is a 69 y.o. female with history of HTN admitted for left BG hemorrhage with intraventricular extension. Symptoms stable but guarded.    Left BG hemorrhage with intraventricular extension - likely due to hypertensive bleed  CT showed left BG hemorrhage with ventricular extension  Repeat CT showed stable hematoma  Repeat CT again in am at day 5  2D Echo  unremarkable  LDL 139, not at the goal  [id number] 5.7  Heparin subq  for VTE prophylaxis  DIET DYS 2 Room service appropriate?: Yes; Fluid consistency:: Nectar Thick   no antithrombotic prior to admission, now on no antithrombotic  BP control with goal < 160  Ongoing aggressive stroke risk factor management  Therapy recommendations:  CIR  Disposition:  Pending  Hyponatremia  Na 131->132->133->135  Urine Na 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, consistent with salt wasting syndrome  On oral salt tablet  continue oral salt 2g tid  Close monitoring  Hypertension  Home meds:   Lisinopril, metoprolol Currently on amlodipine and lisinopril BP goal < 160  Unstable  Add metoprolol to the regimen  Patient counseled to be compliant with her blood pressure medications  Hyperlipidemia  Home meds:   none   Currently on none  LDL 139, goal < 70  Add lipitor 20  Continue statin at discharge  UTI  UA showed WBC too numerous to count  Put on cipro  urine cultures E Coli and sensitive to cipro  Other Stroke Risk Factors  Advanced age  ETOH use - 2-3 drinks daily  Other Active Problems  Leukocytosis - resolved  Other Pertinent History  Not check BP at home  Hospital day # 3   [name], MD PhD Stroke Neurology 11/29/2014 3:31 PM    To contact Stroke Continuity provider, please refer to [email or web address]. After hours, contact General Neurology

## 2014-11-30 ENCOUNTER — Inpatient Hospital Stay (HOSPITAL_COMMUNITY): Payer: Medicare HMO

## 2014-11-30 DIAGNOSIS — I615 Nontraumatic intracerebral hemorrhage, intraventricular: Secondary | ICD-10-CM

## 2014-11-30 DIAGNOSIS — J9600 Acute respiratory failure, unspecified whether with hypoxia or hypercapnia: Secondary | ICD-10-CM

## 2014-11-30 LAB — HEPATIC FUNCTION PANEL
ALT: 23 U/L (ref 14–54)
AST: 22 U/L (ref 15–41)
Albumin: 3.8 g/dL (ref 3.5–5.0)
Alkaline Phosphatase: 56 U/L (ref 38–126)
BILIRUBIN DIRECT: 0.3 mg/dL (ref 0.1–0.5)
Indirect Bilirubin: 0.9 mg/dL (ref 0.3–0.9)
Total Bilirubin: 1.2 mg/dL (ref 0.3–1.2)
Total Protein: 7 g/dL (ref 6.5–8.1)

## 2014-11-30 LAB — BLOOD GAS, ARTERIAL
Acid-Base Excess: 0.5 mmol/L (ref 0.0–2.0)
BICARBONATE: 24 meq/L (ref 20.0–24.0)
DRAWN BY: 369891
FIO2: 1 %
LHR: 16 {breaths}/min
MECHVT: 440 mL
O2 Saturation: 99.8 %
PATIENT TEMPERATURE: 98.6
PEEP: 5 cmH2O
PH ART: 7.458 — AB (ref 7.350–7.450)
TCO2: 25 mmol/L (ref 0–100)
pCO2 arterial: 34.3 mmHg — ABNORMAL LOW (ref 35.0–45.0)
pO2, Arterial: 229 mmHg — ABNORMAL HIGH (ref 80.0–100.0)

## 2014-11-30 LAB — BASIC METABOLIC PANEL
Anion gap: 8 (ref 5–15)
BUN: 10 mg/dL (ref 6–20)
CALCIUM: 9.1 mg/dL (ref 8.9–10.3)
CHLORIDE: 102 mmol/L (ref 101–111)
CO2: 26 mmol/L (ref 22–32)
Creatinine, Ser: 0.54 mg/dL (ref 0.44–1.00)
GFR calc Af Amer: >60 mL/min (ref >60)
GFR calc non Af Amer: >60 mL/min (ref >60)
Glucose, Bld: 148 mg/dL — ABNORMAL HIGH (ref 65–99)
POTASSIUM: 3.5 mmol/L (ref 3.5–5.1)
Sodium: 136 mmol/L (ref 135–145)

## 2014-11-30 LAB — CBC
HCT: 37.6 % (ref 36.0–46.0)
Hemoglobin: 12.4 g/dL (ref 12.0–15.0)
MCH: 32.6 pg (ref 26.0–34.0)
MCHC: 33 g/dL (ref 30.0–36.0)
MCV: 98.9 fL (ref 78.0–100.0)
Platelets: 213 10*3/uL (ref 150–400)
RBC: 3.8 MIL/uL — AB (ref 3.87–5.11)
RDW: 12.6 % (ref 11.5–15.5)
WBC: 6.9 10*3/uL (ref 4.0–10.5)

## 2014-11-30 LAB — AMMONIA: AMMONIA: 22 umol/L (ref 9–35)

## 2014-11-30 MED ORDER — PROPOFOL 1000 MG/100ML IV EMUL
5.0000 ug/kg/min | INTRAVENOUS | Status: DC
  Administered 2014-11-30: 5 ug/kg/min via INTRAVENOUS

## 2014-11-30 MED ORDER — CETYLPYRIDINIUM CHLORIDE 0.05 % MT LIQD
7.0000 mL | Freq: Four times a day (QID) | OROMUCOSAL | Status: DC
  Administered 2014-12-01 – 2014-12-02 (×6): 7 mL via OROMUCOSAL

## 2014-11-30 MED ORDER — PROPOFOL 1000 MG/100ML IV EMUL
INTRAVENOUS | Status: AC
Start: 2014-11-30 — End: 2014-12-01
  Filled 2014-11-30: qty 100

## 2014-11-30 MED ORDER — MORPHINE SULFATE 2 MG/ML IJ SOLN
INTRAMUSCULAR | Status: AC
  Filled 2014-11-30: qty 2

## 2014-11-30 MED ORDER — SODIUM CHLORIDE 0.9 % IV SOLN
INTRAVENOUS | Status: DC
  Administered 2014-11-30 – 2014-12-10 (×6): via INTRAVENOUS

## 2014-11-30 MED ORDER — SODIUM CHLORIDE 0.9 % IV SOLN
500.0000 mg | Freq: Two times a day (BID) | INTRAVENOUS | Status: DC
  Administered 2014-11-30 – 2014-12-10 (×20): 500 mg via INTRAVENOUS
  Filled 2014-11-30 (×23): qty 5

## 2014-11-30 MED ORDER — METOPROLOL TARTRATE 50 MG PO TABS
50.0000 mg | ORAL_TABLET | Freq: Two times a day (BID) | ORAL | Status: DC
  Administered 2014-11-30: 50 mg via ORAL
  Filled 2014-11-30 (×4): qty 1

## 2014-11-30 MED ORDER — MIDAZOLAM HCL 2 MG/2ML IJ SOLN
INTRAMUSCULAR | Status: AC
  Filled 2014-11-30: qty 2

## 2014-11-30 MED ORDER — ATORVASTATIN CALCIUM 20 MG PO TABS
20.0000 mg | ORAL_TABLET | Freq: Every day | ORAL | Status: DC
  Administered 2014-12-01 – 2014-12-09 (×8): 20 mg
  Filled 2014-11-30 (×10): qty 1

## 2014-11-30 MED ORDER — NICARDIPINE HCL IN NACL 20-0.86 MG/200ML-% IV SOLN
INTRAVENOUS | Status: AC
  Administered 2014-11-30: 5 mg via INTRAVENOUS
  Filled 2014-11-30: qty 200

## 2014-11-30 MED ORDER — ROCURONIUM BROMIDE 50 MG/5ML IV SOLN
50.0000 mg | Freq: Once | INTRAVENOUS | Status: AC
  Administered 2014-11-30: 50 mg via INTRAVENOUS

## 2014-11-30 MED ORDER — PANTOPRAZOLE SODIUM 40 MG IV SOLR
40.0000 mg | INTRAVENOUS | Status: DC
  Administered 2014-11-30: 40 mg via INTRAVENOUS
  Filled 2014-11-30 (×2): qty 40

## 2014-11-30 MED ORDER — SODIUM CHLORIDE 0.9 % IV SOLN
1000.0000 mg | Freq: Once | INTRAVENOUS | Status: AC
  Administered 2014-11-30: 1000 mg via INTRAVENOUS
  Filled 2014-11-30: qty 10

## 2014-11-30 MED ORDER — FUROSEMIDE 10 MG/ML IJ SOLN
20.0000 mg | Freq: Once | INTRAMUSCULAR | Status: AC
  Administered 2014-11-30: 20 mg via INTRAVENOUS
  Filled 2014-11-30: qty 4

## 2014-11-30 MED ORDER — ETOMIDATE 2 MG/ML IV SOLN
10.0000 mg | Freq: Once | INTRAVENOUS | Status: AC
  Administered 2014-11-30: 10 mg via INTRAVENOUS

## 2014-11-30 MED ORDER — FENTANYL CITRATE (PF) 100 MCG/2ML IJ SOLN
100.0000 ug | Freq: Once | INTRAMUSCULAR | Status: AC
  Administered 2014-11-30: 100 ug via INTRAVENOUS

## 2014-11-30 MED ORDER — CIPROFLOXACIN IN D5W 400 MG/200ML IV SOLN
400.0000 mg | Freq: Two times a day (BID) | INTRAVENOUS | Status: DC
  Administered 2014-11-30 – 2014-12-01 (×3): 400 mg via INTRAVENOUS
  Filled 2014-11-30 (×4): qty 200

## 2014-11-30 MED ORDER — CHLORHEXIDINE GLUCONATE 0.12 % MT SOLN
15.0000 mL | Freq: Two times a day (BID) | OROMUCOSAL | Status: DC
  Administered 2014-11-30 – 2014-12-01 (×3): 15 mL via OROMUCOSAL
  Filled 2014-11-30 (×3): qty 15

## 2014-11-30 MED ORDER — MIDAZOLAM HCL 2 MG/2ML IJ SOLN
INTRAMUSCULAR | Status: AC
  Filled 2014-11-30: qty 4

## 2014-11-30 MED ORDER — CETYLPYRIDINIUM CHLORIDE 0.05 % MT LIQD: 7.0000 mL | Freq: Two times a day (BID) | OROMUCOSAL | Status: DC

## 2014-11-30 MED ORDER — NICARDIPINE HCL IN NACL 20-0.86 MG/200ML-% IV SOLN
3.0000 mg/h | INTRAVENOUS | Status: DC
  Administered 2014-11-30: 5 mg via INTRAVENOUS
  Administered 2014-11-30: 8 mg/h via INTRAVENOUS
  Filled 2014-11-30: qty 200

## 2014-11-30 MED ORDER — FENTANYL CITRATE (PF) 100 MCG/2ML IJ SOLN
25.0000 ug | INTRAMUSCULAR | Status: DC | PRN
Start: 2014-11-30 — End: 2014-12-01
  Filled 2014-11-30 (×2): qty 2

## 2014-11-30 MED ORDER — FENTANYL CITRATE (PF) 100 MCG/2ML IJ SOLN
INTRAMUSCULAR | Status: AC
  Filled 2014-11-30: qty 4

## 2014-11-30 NOTE — Op Note (Signed)
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  Asst. Surgeon: None  Anesthesia: Local lidocaine with epinephrine   Indication: 69 year old with progressive hydrocephalus following intraventricular hemorrhage   Operative note: Patient was positioned in the intensive care unit in the supine position. Patient's right frontal scalp prepped and draped sterilely. Local lidocaine with epinephrine infiltrated into the planned incision site. Small incision made a properly 1 cm anterior to her coronal suture along her midpupillary line. Twist drill hole was then made area and dura pierced using a spinal needle. Ventriculostomy catheter placed into the right frontal horn releasing CSF under pressure. CSF mildly blood-tinged. Overall pressure elevated but not terribly high. Catheter sutured in place. Catheter connected to an external drainage system. Sterile dressing applied. No apparent complications.

## 2014-11-30 NOTE — Procedures (Signed)
Intubation Procedure Note Conard NovakChantal A Hepworth [id number] 09/07/1945  Procedure: Intubation Indications: Airway protection and maintenance  Procedure Details Consent: Dr Sung AmabileSimonds intubated pt. Time Out: Verified patient identification, verified procedure, site/side was marked, verified correct patient position, special equipment/implants available, medications/allergies/relevent history reviewed, required imaging and test results available.  Performed  Maximum sterile technique was used including antiseptics and gloves.  MAC and 3 Laryngoscope #3 sheath  Dr. Sung AmabileSimonds intubated pt, #7.5 OET secure at 21 at lip.  BBS[facility] = clear, + easy cap color change (purple to yellow). Sat 100% on 100% fio2.    Evaluation Hemodynamic Status: BP stable throughout; O2 sats: stable throughout Patient's Current Condition: stable Complications: No apparent complications Patient did tolerate procedure well. Chest X-ray ordered to verify placement.  CXR: pending.   Jennette KettleBrowning, Chaunda Vandergriff Joy 11/30/2014

## 2014-11-30 NOTE — Procedures (Signed)
Preprocedure diagnosis: Hydrocephalus and clotted ventricular catheter  Post procedure diagnosis same  Procedure: Replacement of intraventricular catheter through a separate skin incision and a separate bur hole.  Surgeon: Donalee CitrinGary Raiza Kiesel   Description of procedure: Patient had an interventricular hemorrhage placed approximately hour and half prior and it was putting out very minimally then an aggressive amount of CSF up to 60 mL dumped out immediately followed by bright red blood in the catheter tubing I attempted to irrigate with no success I then removed the old catheter re-prepped her right frontal area made a separate incision and a new burr hole over Coker's point pierce the dura with a spinal needle passed the catheter and bright red CSF was immediately draining up per the catheter this was of the collecting system and the catheter was sewn in placed and the head was dressed in sterile routine sterile fashion. All exam was extremely somewhat lethargic before during and after procedure unchanged.

## 2014-11-30 NOTE — Progress Notes (Signed)
Patient ID: Conard Novakhantal A Shoe, female   DOB: 09/03/1945, 69 y.o.   MRN: [id number] Called to see patient earlier this evening because of a occluded ventricular catheter after attempts to irrigate through no additional flow was achieved so I replaced the ventricular catheter. The patient been noted be more somnolent and less arousable and medially prior to the catheter clotting off there was a large egress of spinal fluid over 60 mL of hypertensive spike and then acute blood seen in the catheter tip. I felt this was consistent with recurrent hemorrhage I replaced the clotted catheter guide bloody CSF flow patient was then intubated and sent down for follow-up CT scan. Follow up CT scan does show complete casting of her ventricular anatomy with a significantly larger intra- ventricular hemorrhage. This is consistent with a history of large dump of spinal fluid hypertensive spike and acute blood in the catheter. I have talked to the family extensively the catheter currently is in good position there is some slightly sluggish bloody flow I do not recommend replacing the catheter if it should get clotted off we will continue to observe throughout the evening. The family understands this and agrees to current plan.

## 2014-11-30 NOTE — Procedures (Signed)
Central Venous Catheter Insertion Procedure Note Kathryn NovakChantal A Khan [id number] 06/16/1946  Procedure: Insertion of Central Venous Catheter Indications: Assessment of intravascular volume and Drug and/or fluid administration  Procedure Details Consent: Risks of procedure as well as the alternatives and risks of each were explained to the (patient/caregiver).  Consent for procedure obtained. Time Out: Verified patient identification, verified procedure, site/side was marked, verified correct patient position, special equipment/implants available, medications/allergies/relevent history reviewed, required imaging and test results available.  Performed  Maximum sterile technique was used including antiseptics, cap, gloves, gown, hand hygiene, mask and sheet. Skin prep: Chlorhexidine; local anesthetic administered A antimicrobial bonded/coated triple lumen catheter was placed in the right internal jugular vein using the Seldinger technique.  Evaluation Blood flow good Complications: No apparent complications Patient did tolerate procedure well. Chest X-ray ordered to verify placement.  CXR: pending.  Emeterio Reeveddy J Makenleigh Crownover 11/30/2014, 9:20 PM

## 2014-11-30 NOTE — Progress Notes (Signed)
Report given to Tammy. Patient transported by charge nurse and Clinical research associatewriter.

## 2014-11-30 NOTE — Significant Event (Addendum)
Rapid Response Event Note  Overview:  Called to assist with patient with decreased LOC Time Called: 0825 Arrival Time: 0835 Event Type: Neurologic  Initial Focused Assessment:  On arrival patient supine in bed.  Skin warm to touch dry - face flushed.  RR reg and unlabored.  Bil BS present - few coarse Rh.  Purposeful with left hand - eyes dysconjugate - pupils 2 mm equal and reactive.  Right arm without movement - right leg withdraws to pain.  No eye opening to voice. Does not follow commands.  Abd soft nontender.  BP 175/85 HR 76 SR RR 20 O2 sats 99% on 2 liter nasal cannula.  Family present in room.  They state she stated becoming more lethargic yesterday evening - had been communicating and following commands until last pm - overnight RN reports patient had period of restlessness followed by quiet rest.     Interventions:  RN Jaquita RectorKery on phone with Dr. Roda ShuttersXu.  Stat CT scan head without contrast.  After scan patient showing some increased awareness - very active with left hand - purposeful - will grip with left hand ? Releasing to command. Right side remains same.  Rectal temp done - 100.1.  Dr. Roda ShuttersXu at bedside.  Eyes remain dysconjugate.  Will open eyes some to voice and spontaneously.  Oral care given - back of throat suctioned = has cough and gag reflex present.  PCXR done per order Dr. Roda ShuttersXu.  Dr. Roda ShuttersXu at bedside = family updated with results xrays.  Family questions answered. Patient to remain NPO at present.  163/71 HR 78 RR 20 O2 sats 96%.  [facility] - will follow - call as needed.   1345:  Follow up :  RN Marylene LandAngela called to report patient transferring to ICU for ventic catheter placement - no RRT needed.  Transferred without incident.     Event Summary: Name of Physician Notified: Dr. Roda ShuttersXu at  (pta RRT)    at    Outcome: Stayed in room and stabalized  Event End Time: 1000  Delton PrairieBritt, Natividad Schlosser L

## 2014-11-30 NOTE — Consult Note (Signed)
Reason for Consult: Hydrocephalus Referring Physician: [name] is an 69 y.o. female.  HPI: 69 year old female admitted with left temporal parietal likely hypertensive hemorrhage with some intraventricular extension. Patient with progressive diminishment of her level of consciousness and speech over last 24 hours. Follow-up head CT scans demonstrate progressive ventricular enlargement consistent with progressive communicating hydrocephalus. No new areas of hemorrhage.  I have been asked to place a ventriculostomy.  Past Medical History  Diagnosis Date  . Hypertension     Past Surgical History  Procedure Laterality Date  . Knee surgery    . Shoulder surgery      No family history on file.  Social History:  reports that she quit smoking about 18 years ago. She has never used smokeless tobacco. She reports that she does not drink alcohol or use illicit drugs.  Allergies:  Allergies  Allergen Reactions  . Penicillins Anaphylaxis  . Codeine Nausea And Vomiting  . Sulfa Antibiotics     Medications: I have reviewed the patient's current medications.  Results for orders placed or performed during the hospital encounter of 11/26/14 (from the past 48 hour(s))  Clostridium Difficile by PCR     Status: None   Collection Time: 11/28/14 10:02 PM  Result Value Ref Range   C difficile by pcr NEGATIVE NEGATIVE  CBC     Status: Abnormal   Collection Time: 11/29/14  4:48 AM  Result Value Ref Range   WBC 7.8 4.0 - 10.5 K/uL   RBC 3.79 (L) 3.87 - 5.11 MIL/uL   Hemoglobin 12.2 12.0 - 15.0 g/dL   HCT 37.4 36.0 - 46.0 %   MCV 98.7 78.0 - 100.0 fL   MCH 32.2 26.0 - 34.0 pg   MCHC 32.6 30.0 - 36.0 g/dL   RDW 12.8 11.5 - 15.5 %   Platelets 189 150 - 400 K/uL  Basic metabolic panel     Status: Abnormal   Collection Time: 11/29/14  4:48 AM  Result Value Ref Range   Sodium 135 135 - 145 mmol/L   Potassium 3.7 3.5 - 5.1 mmol/L   Chloride 104 101 - 111 mmol/L   CO2 25 22 - 32 mmol/L    Glucose, Bld 149 (H) 65 - 99 mg/dL   BUN 11 6 - 20 mg/dL   Creatinine, Ser 0.54 0.44 - 1.00 mg/dL   Calcium 8.8 (L) 8.9 - 10.3 mg/dL   GFR calc non Af Amer >60 >60 mL/min   GFR calc Af Amer >60 >60 mL/min    Comment: (NOTE) The eGFR has been calculated using the CKD EPI equation. This calculation has not been validated in all clinical situations. eGFR's persistently <60 mL/min signify possible Chronic Kidney Disease.    Anion gap 6 5 - 15  Urinalysis with microscopic     Status: Abnormal   Collection Time: 11/29/14  5:30 PM  Result Value Ref Range   Color, Urine YELLOW YELLOW   APPearance CLOUDY (A) CLEAR   Specific Gravity, Urine 1.011 1.005 - 1.030   pH 6.0 5.0 - 8.0   Glucose, UA NEGATIVE NEGATIVE mg/dL   Hgb urine dipstick SMALL (A) NEGATIVE   Bilirubin Urine NEGATIVE NEGATIVE   Ketones, ur NEGATIVE NEGATIVE mg/dL   Protein, ur NEGATIVE NEGATIVE mg/dL   Urobilinogen, UA 0.2 0.0 - 1.0 mg/dL   Nitrite NEGATIVE NEGATIVE   Leukocytes, UA SMALL (A) NEGATIVE   WBC, UA 11-20 <3 WBC/hpf   RBC / HPF 3-6 <3 RBC/hpf  Bacteria, UA RARE RARE   Squamous Epithelial / LPF FEW (A) RARE  CBC     Status: Abnormal   Collection Time: 11/30/14  3:44 AM  Result Value Ref Range   WBC 6.9 4.0 - 10.5 K/uL   RBC 3.80 (L) 3.87 - 5.11 MIL/uL   Hemoglobin 12.4 12.0 - 15.0 g/dL   HCT 37.6 36.0 - 46.0 %   MCV 98.9 78.0 - 100.0 fL   MCH 32.6 26.0 - 34.0 pg   MCHC 33.0 30.0 - 36.0 g/dL   RDW 12.6 11.5 - 15.5 %   Platelets 213 150 - 400 K/uL  Basic metabolic panel     Status: Abnormal   Collection Time: 11/30/14  3:44 AM  Result Value Ref Range   Sodium 136 135 - 145 mmol/L   Potassium 3.5 3.5 - 5.1 mmol/L   Chloride 102 101 - 111 mmol/L   CO2 26 22 - 32 mmol/L   Glucose, Bld 148 (H) 65 - 99 mg/dL   BUN 10 6 - 20 mg/dL   Creatinine, Ser 0.54 0.44 - 1.00 mg/dL   Calcium 9.1 8.9 - 10.3 mg/dL   GFR calc non Af Amer >60 >60 mL/min   GFR calc Af Amer >60 >60 mL/min    Comment: (NOTE) The  eGFR has been calculated using the CKD EPI equation. This calculation has not been validated in all clinical situations. eGFR's persistently <60 mL/min signify possible Chronic Kidney Disease.    Anion gap 8 5 - 15    Ct Head Wo Contrast  11/30/2014   CLINICAL DATA:  69 year old with decreased consciousness and intracerebral hemorrhage.  EXAM: CT HEAD WITHOUT CONTRAST  TECHNIQUE: Contiguous axial images were obtained from the base of the skull through the vertex without contrast.  COMPARISON:  11/30/2014  FINDINGS: Again noted is a large intraparenchymal hemorrhage centered around the left thalamus. The hemorrhage roughly measures 3.9 x 2.8 cm and unchanged. There is stable vasogenic edema around this hemorrhage. Again noted is mild left-to-right midline shift, roughly measuring 4 mm. Again noted is intraventricular hemorrhage. Again noted is blood within the cerebral aqueduct and stable dilatation of the lateral and third ventricles. Again noted is low-density in the periventricular white matter that could represent chronic changes or transependymal edema. There is a small focus of high density in the left parietal lobe on image 22 with adjacent low density. This could represent a small focus of subarachnoid blood and unchanged. Small amount of fluid in the right sphenoid sinus. No acute bone abnormality.  IMPRESSION: Stable size of the left thalamic hematoma. Stable vasogenic edema and stable mild midline shift.  Stable obstructive hydrocephalus with blood in the cerebral aqueduct.   Electronically Signed   By: Markus Daft M.D.   On: 11/30/2014 09:27   Ct Head Wo Contrast  11/30/2014   CLINICAL DATA:  Intracranial hemorrhage  EXAM: CT HEAD WITHOUT CONTRAST  TECHNIQUE: Contiguous axial images were obtained from the base of the skull through the vertex without intravenous contrast.  COMPARISON:  CT head 11/26/2014  FINDINGS: Left thalamic hemorrhage unchanged measuring 30 x 38 mm. There is  intraventricular hemorrhage. Blood has migrated into the occipital horns but is similar in amount.  Progressive obstructive hydrocephalus. Progressive dilatation of the lateral and third ventricles. Fourth ventricle not dilated. No blood in the fourth ventricle.  Mild midline shift 4 mm unchanged  Chronic microvascular ischemia in the white matter. Edema surrounding the left colonic hemorrhage. No acute ischemic infarct.  IMPRESSION: Left thalamic hemorrhage and intraventricular hemorrhage unchanged in volume  Progressive ventricular enlargement compatible with obstructive hydrocephalus at the level of the aqueduct.   Electronically Signed   By: Franchot Gallo M.D.   On: 11/30/2014 08:24   Dg Chest Port 1 View  11/30/2014   CLINICAL DATA:  Decreased level of consciousness. Evaluate for aspiration. Intracerebral hemorrhage.  EXAM: PORTABLE CHEST - 1 VIEW  COMPARISON:  11/26/2014  FINDINGS: Few densities at the lung bases are most compatible with atelectasis. No large areas of airspace disease. Stable appearance of the heart and mediastinum.  IMPRESSION: No acute chest findings.  Mild basilar atelectasis.   Electronically Signed   By: Markus Daft M.D.   On: 11/30/2014 09:52    Review of systems not obtained due to patient factors. Blood pressure 157/81, pulse 71, temperature 97.9 F (36.6 C), temperature source Axillary, resp. rate 16, height '5\' 4"'  (1.626 m), weight 72.7 kg (160 lb 4.4 oz), SpO2 98 %. The patient is somnolent but will awaken to mild noxious stimuli. She moans but is nonverbal. Her pupils are 3 mm and equal bilaterally. Corneal reflexes are normal. Patient moves all 4 extremities purposefully but not to command. Examination head ears eyes and throat is unremarkable. Chest and abdomen benign.  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 I discussed situation with patient and her family. They understand the current situation and prognosis. I  recommended placement of a right frontal ventriculostomy. I discussed the risks and benefits. The patient's family wishes to proceed.  Kathryn Khan A 11/30/2014, 3:32 PM

## 2014-11-30 NOTE — Progress Notes (Signed)
Called to patient room emergently by family. Charge nurse 2 RNs and tech present. Patient opens eyes both not making eye contact. Eyes deviating down and to the right. Bilateral blink diminished. David Rhinehuls notified. No new orders at this time, but will round for assessment.

## 2014-11-30 NOTE — Progress Notes (Signed)
STAT CT ordered. Patient to be accompanied by rapid response and charge nurse.

## 2014-11-30 NOTE — Progress Notes (Signed)
STROKE TEAM PROGRESS NOTE   SUBJECTIVE (INTERVAL HISTORY) Her daughters and husband are at the bedside.  Her condition is stable overnight. Today 5:30am she had CT head which showed evolving left BG hematoma. However, after coming back from CT she was drowsy sleepy and less responsive than yesterday with concerns of some eye deviation to right and down as well as some tremors of left hand. Repeat CT head showed obstructive hydrocephalus. On rounding she still lethargic but intermittently following commands, left UE purposeful movement. CXR no acute findings. Na 136, given lasix . Although less suspicious for seizure like activity, will put on keppra for seizure prophylaxis. NSG Dr. Jordan Likes called for consultation of obstructive hydrocephalus.   OBJECTIVE Temp:  [97.9 F (36.6 C)-100.1 F (37.8 C)] 100.1 F (37.8 C) (05/22 0900) Pulse Rate:  [67-84] 67 (05/22 1200) Cardiac Rhythm:  [-] Normal sinus rhythm (05/21 2010) Resp:  [18-20] 18 (05/22 0623) BP: (115-181)/(69-103) 153/69 mmHg (05/22 1200) SpO2:  [94 %-99 %] 98 % (05/22 1200)   Recent Labs Lab 11/26/14 0536  GLUCAP 195*    Recent Labs Lab 11/26/14 0510 11/26/14 0517 11/26/14 1543 11/27/14 1035 11/28/14 0215 11/29/14 0448 11/30/14 0344  NA 132* 132* 133* 131* 133* 135 136  K 4.1 4.1  --  3.1* 3.2* 3.7 3.5  CL 98* 97*  --  97* 100* 104 102  CO2 22  --   --  GLUCOSE 183* 189*  --  173* 137* 149* 148*  BUN 14 17  --  CREATININE 0.66 0.60  --  0.48 0.61 0.54 0.54  CALCIUM 9.7  --   --  8.8* 8.9 8.8* 9.1    Recent Labs Lab 11/26/14 0510  AST 30  ALT 30  ALKPHOS 66  BILITOT 1.1  PROT 7.6  ALBUMIN 4.4    Recent Labs Lab 11/26/14 0510 11/26/14 0517 11/27/14 1035 11/28/14 0215 11/29/14 0448 11/30/14 0344  WBC 13.0*  --  8.8 8.5 7.8 6.9  NEUTROABS 11.7*  --   --   --   --   --   HGB 14.1 15.3* 13.0 12.1 12.2 12.4  HCT 40.1 45.0 39.2 36.5 37.4 37.6  MCV 97.1  --  97.8 98.1 98.7 98.9   PLT 209  --  200 205 189 213   No results for input(s): CKTOTAL, CKMB, CKMBINDEX, TROPONINI in the last 168 hours. No results for input(s): LABPROT, INR in the last 72 hours.  Recent Labs  11/29/14 1730  COLORURINE YELLOW  LABSPEC 1.011  PHURINE 6.0  GLUCOSEU NEGATIVE  HGBUR SMALL*  BILIRUBINUR NEGATIVE  KETONESUR NEGATIVE  PROTEINUR NEGATIVE  UROBILINOGEN 0.2  NITRITE NEGATIVE  LEUKOCYTESUR SMALL*       Component Value Date/Time   CHOL 226* 11/26/2014 1200   TRIG 47 11/26/2014 1200   HDL 78 11/26/2014 1200   CHOLHDL 2.9 11/26/2014 1200   VLDL 9 11/26/2014 1200   LDLCALC 139* 11/26/2014 1200   Lab Results  Component Value Date   HGBA1C 5.7* 11/26/2014   No results found for: LABOPIA, COCAINSCRNUR, LABBENZ, AMPHETMU, THCU, LABBARB  No results for input(s): ETH in the last 168 hours.  I have personally reviewed the radiological images below and agree with the radiology interpretations.  Ct Head (brain) Wo Contrast 11/30/14 - Left thalamic hemorrhage and intraventricular hemorrhage unchanged in volume. Progressive ventricular enlargement compatible with obstructive hydrocephalus at the level of the aqueduct.  11/26/14 - Unchanged left thalamic  hemorrhage with intraventricular extension.  11/26/2014   IMPRESSION: Acute 3.7 cm bleed in the left basal ganglia with decompression into the ventricles, moderate volume of intraventricular blood. There is 5 mm left-to-right midline shift.     Chest Port 1 View 11/30/14 - No acute chest findings. Mild basilar atelectasis.  11/26/2014    IMPRESSION: Prominence of the pulmonary interstitial markings and probable small left pleural effusion. Without previous studies it is not possible no with these findings are acute or old. A PA and lateral chest x-ray with deep inspiratory effort would be useful.    2D Echocardiogram  Left ventricle: The cavity size was normal. There was moderate concentric hypertrophy. Systolic function was  normal. The estimated ejection fraction was in the range of 55% to 60%. Wall motion was normal; there were no regional wall motion abnormalities. Doppler parameters are consistent with abnormal left ventricular relaxation (grade 1 diastolic dysfunction). - Mitral valve: Mildly to moderately calcified annulus. - Left atrium: The atrium was moderately dilated. Impressions: - No cardiac source of emboli was indentified.  EKG  normal sinus rhythm. For complete results please see formal report.  EEG pending  PHYSICAL EXAM  Temp:  [97.9 F (36.6 C)-100.1 F (37.8 C)] 100.1 F (37.8 C) (05/22 0900) Pulse Rate:  [67-84] 67 (05/22 1200) Resp:  [18-20] 18 (05/22 0623) BP: (115-181)/(69-103) 153/69 mmHg (05/22 1200) SpO2:  [94 %-99 %] 98 % (05/22 1200)  General - Well nourished, well developed, lethargic and eyes open on voice but not following commands.  Ophthalmologic - not cooperative on exam.  Cardiovascular - Regular rate and rhythm with no murmur.  Neuro - lethargic, eyes open on voice but not following simple commands. Nonverbal. PERRL, eyes attending to both sides, inconsistently blinking to visual threat on the right, right facial droop, tongue in middle in mouth. Right UE 0/5 and RLE no spontaneous movement but 2+/5 on pain stimulation. LUE and LLE spontaneous movement and localizing to pain. Right babinski positive. Reflex 1+. Sensation, coordination and gait not tested.   ASSESSMENT/PLAN Kathryn Khan is a 69 y.o. female with history of HTN admitted for left BG hemorrhage with intraventricular extension. Symptoms stable but guarded.    Left BG hemorrhage with intraventricular extension - likely due to hypertensive bleed  CT showed left BG hemorrhage with ventricular extension  Repeat CT showed stable hematoma  Repeat CT head 5/22 showed obstructive hydrocephalus  2D Echo  unremarkable  LDL 139, not at the goal  [id number] 5.7  Heparin subq  for VTE  prophylaxis  DIET DYS 2 Room service appropriate?: Yes; Fluid consistency:: Nectar Thick   no antithrombotic prior to admission, now on no antithrombotic  BP control with goal < 160  Ongoing aggressive stroke risk factor management  Therapy recommendations:  CIR  Disposition:  Pending  Obstructive hydrocephalus  Showing on the repeat CT this am  Likely to explain her lethargy and clinical decline  NSG Dr. Jordan Likes consulted for EVD placement  Pt will transfer back to neuro ICU for EVD  Family informed.   ? 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, consistent with salt wasting syndrome  On oral salt tablet  continue oral salt 2g tid  Close monitoring  Hypertension  Home meds:   Lisinopril, metoprolol Currently on amlodipine and lisinopril and metoprolol BP goal < 160  Unstable  increase metoprolol to 50mg  bid  Patient counseled to be compliant with her blood pressure medications  Hyperlipidemia  Home meds:  none   Currently on none  LDL 139, goal < 70  Add lipitor 20  Continue statin at discharge  UTI  UA showed WBC too numerous to count  Put on cipro  urine cultures E Coli and sensitive to cipro  Change cipor to IV due to limited po access  Other Stroke Risk Factors  Advanced age  ETOH use - 2-3 drinks daily  Other Active Problems  Leukocytosis - resolved  Other Pertinent History  Not check BP at home  Hospital day # 4   Marvel PlanJindong Ciena Sampley, MD PhD Stroke Neurology 11/30/2014 12:44 PM    To contact Stroke Continuity provider, please refer to [email or web address]. After hours, contact General Neurology

## 2014-12-01 ENCOUNTER — Inpatient Hospital Stay (HOSPITAL_COMMUNITY): Payer: Medicare HMO

## 2014-12-01 DIAGNOSIS — I615 Nontraumatic intracerebral hemorrhage, intraventricular: Secondary | ICD-10-CM | POA: Insufficient documentation

## 2014-12-01 DIAGNOSIS — E876 Hypokalemia: Secondary | ICD-10-CM

## 2014-12-01 DIAGNOSIS — R001 Bradycardia, unspecified: Secondary | ICD-10-CM

## 2014-12-01 DIAGNOSIS — J9600 Acute respiratory failure, unspecified whether with hypoxia or hypercapnia: Secondary | ICD-10-CM | POA: Insufficient documentation

## 2014-12-01 LAB — BASIC METABOLIC PANEL
ANION GAP: 6 (ref 5–15)
BUN: 21 mg/dL — AB (ref 6–20)
CO2: 27 mmol/L (ref 22–32)
CREATININE: 0.63 mg/dL (ref 0.44–1.00)
Calcium: 8.8 mg/dL — ABNORMAL LOW (ref 8.9–10.3)
Chloride: 101 mmol/L (ref 101–111)
GLUCOSE: 168 mg/dL — AB (ref 65–99)
POTASSIUM: 3.4 mmol/L — AB (ref 3.5–5.1)
Sodium: 134 mmol/L — ABNORMAL LOW (ref 135–145)

## 2014-12-01 LAB — CBC
HCT: 35.1 % — ABNORMAL LOW (ref 36.0–46.0)
Hemoglobin: 11.7 g/dL — ABNORMAL LOW (ref 12.0–15.0)
MCH: 32.7 pg (ref 26.0–34.0)
MCHC: 33.3 g/dL (ref 30.0–36.0)
MCV: 98 fL (ref 78.0–100.0)
Platelets: 202 10*3/uL (ref 150–400)
RBC: 3.58 MIL/uL — ABNORMAL LOW (ref 3.87–5.11)
RDW: 12.6 % (ref 11.5–15.5)
WBC: 11.6 10*3/uL — AB (ref 4.0–10.5)

## 2014-12-01 LAB — GLUCOSE, CAPILLARY: GLUCOSE-CAPILLARY: 137 mg/dL — AB (ref 65–99)

## 2014-12-01 MED ORDER — FENTANYL CITRATE (PF) 100 MCG/2ML IJ SOLN
25.0000 ug | INTRAMUSCULAR | Status: DC | PRN
  Administered 2014-12-01 – 2014-12-02 (×4): 50 ug via INTRAVENOUS
  Filled 2014-12-01 (×3): qty 2

## 2014-12-01 MED ORDER — PRO-STAT SUGAR FREE PO LIQD
30.0000 mL | Freq: Every day | ORAL | Status: DC
  Administered 2014-12-01 – 2014-12-09 (×7): 30 mL
  Filled 2014-12-01 (×10): qty 30

## 2014-12-01 MED ORDER — VITAL HIGH PROTEIN PO LIQD
1000.0000 mL | ORAL | Status: DC
  Filled 2014-12-01 (×2): qty 1000

## 2014-12-01 MED ORDER — SODIUM CHLORIDE 1 G PO TABS
2.0000 g | ORAL_TABLET | Freq: Three times a day (TID) | ORAL | Status: DC
  Administered 2014-12-01 – 2014-12-08 (×16): 2 g
  Filled 2014-12-01 (×24): qty 2

## 2014-12-01 MED ORDER — CIPROFLOXACIN HCL 500 MG PO TABS: 500.0000 mg | ORAL_TABLET | Freq: Two times a day (BID) | ORAL | Status: DC

## 2014-12-01 MED ORDER — POTASSIUM CHLORIDE 20 MEQ/15ML (10%) PO SOLN
40.0000 meq | Freq: Two times a day (BID) | ORAL | Status: AC
  Administered 2014-12-01 (×2): 40 meq
  Filled 2014-12-01 (×2): qty 30

## 2014-12-01 MED ORDER — CIPROFLOXACIN HCL 500 MG PO TABS
500.0000 mg | ORAL_TABLET | Freq: Two times a day (BID) | ORAL | Status: DC
  Administered 2014-12-01: 500 mg
  Filled 2014-12-01 (×4): qty 1

## 2014-12-01 MED ORDER — FAMOTIDINE 40 MG/5ML PO SUSR
20.0000 mg | Freq: Two times a day (BID) | ORAL | Status: DC
  Administered 2014-12-01 – 2014-12-09 (×15): 20 mg
  Filled 2014-12-01 (×21): qty 2.5

## 2014-12-01 MED ORDER — VITAL AF 1.2 CAL PO LIQD
1000.0000 mL | ORAL | Status: DC
  Administered 2014-12-01 – 2014-12-03 (×3): 1000 mL
  Filled 2014-12-01 (×5): qty 1000

## 2014-12-01 NOTE — Plan of Care (Signed)
Problem: Progression Outcomes Goal: Bowel & Bladder Continence Outcome: Progressing Foley catheter

## 2014-12-01 NOTE — Consult Note (Signed)
PULMONARY / CRITICAL CARE MEDICINE   Name: Kathryn Khan MRN: [id number] DOB: 08/24/1945    ADMISSION DATE:  11/26/2014 CONSULTATION DATE:  5/23  REFERRING MD :  Tora KindredXu/Stroke   INITIAL PRESENTATION:  [age] F adm to stroke service 5/18 with acute L basal ganglia hemorrhage and IVH. Had progressive hydrocephalus and underwent R frontal ventriculostomy 5/22. Catheter clotted requiring replacement. Progressive obtundation required ETI 5/22 PM. 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 R hemiplegia and aphasia on exam 5/18 CT head: Acute 3.7 cm bleed in the left basal ganglia with decompression into the ventricles, moderate volume of intraventricular blood. There is 5 mm left-to-right midline shift 5/18 repeat CT head: Unchanged left thalamic hemorrhage with intraventricular extension. 5/19 More awake alert and can repeat with answering some questions appropriately but severe dysarthria 5/20 Overall stable 5/21 Increased BP. Metoprolol initiated 5/22 AM: Rapid response call for decreased LOC. Low grade fever. Pyuria. Ciprofloxacin initiated 5/22 Stat CT head: Left thalamic hemorrhage and intraventricular hemorrhage unchanged in volume. 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 R frontal ventriculostomy placed  5/22 Intubated for depressed LOC 5/22 ventric drain malfunction. IVC replaced [name]([place]) 5/22 repeat CT head: Stable size of the left thalamic hematoma. Stable vasogenic edema and stable mild midline shift. Stable obstructive hydrocephalus with blood in the cerebral aqueduct   INDWELLING DEVICES:: ETT 5/22 >>  R IJ CVL 5/22 >>   MICRO DATA: MRSA PCR 5/18 >> NEG Urine 5/19 >> E coli C diff PCR 5/20 >> NEG  ANTIMICROBIALS:  Cipro 5/19 >>     HISTORY OF PRESENT ILLNESS:   History as above. Pt unable  to provide further history due to intubated state. Hospital records reviewed. Discussed with Dr Roda ShuttersXu  PAST MEDICAL HISTORY :   has a past medical history of Hypertension.  has past surgical history that includes Knee surgery and Shoulder surgery. Prior to Admission medications   Medication Sig Start Date End Date Taking? Authorizing Provider  atorvastatin (LIPITOR) 10 MG tablet Take 10 mg by mouth daily.   Yes Historical Provider, MD  lisinopril (PRINIVIL,ZESTRIL) 20 MG tablet Take 20 mg by mouth daily.   Yes Historical Provider, MD  metoprolol (LOPRESSOR) 50 MG tablet Take 50 mg by mouth 2 (two) times daily.   Yes Historical Provider, MD   Allergies  Allergen Reactions  . Penicillins Anaphylaxis  . Codeine Nausea And Vomiting  . Sulfa Antibiotics     FAMILY HISTORY:  has no family status information on file.  SOCIAL HISTORY:  reports that she quit smoking about 18 years ago. She has never used smokeless tobacco. She reports that she does not drink alcohol or use illicit drugs.  REVIEW OF SYSTEMS:  Level 5 caveat  SUBJECTIVE:  RASS -4. Not F/C   VITAL SIGNS: Temp:  [97.9 F (36.6 C)-99 F (37.2 C)] 98.8 F (37.1 C) (05/23 1159) Pulse Rate:  [57-107] 59 (05/23 1230) Resp:  [14-25] 15 (05/23 1230) BP: (100-209)/(51-135) 121/61 mmHg (05/23 1230) SpO2:  [93 %-100 %] 99 % (05/23 1230) FiO2 (%):  [40 %-100 %] 40 % (05/23 1159) HEMODYNAMICS:   VENTILATOR SETTINGS: Vent Mode:  [-] PCV FiO2 (%):  [40 %-100 %] 40 % Set Rate:  [14 bmp-16 bmp] 14 bmp Vt Set:  [440 mL] 440 mL PEEP:  [5 cmH20] 5 cmH20 Plateau Pressure:  [7  cmH20-14 cmH20] 12 cmH20 INTAKE / OUTPUT:  Intake/Output Summary (Last 24 hours) at 12/01/14 1302 Last data filed at 12/01/14 1200  Gross per 24 hour  Intake 1972.67 ml  Output   1885 ml  Net  87.67 ml    PHYSICAL EXAMINATION: General: WDWN, intubated, NAD Neuro:  EOMI, PERRL, R hemiplegia, moves LUE semi-purposefully HEENT: NCAT Cardiovascular: Huston Foley,  reg, no M Lungs: Clear Abdomen: Soft, NT, +BS Ext: warm, no edema  LABS:  CBC  Recent Labs Lab 11/29/14 0448 11/30/14 0344 12/01/14 0600  WBC 7.8 6.9 11.6*  HGB 12.2 12.4 11.7*  HCT 37.4 37.6 35.1*  PLT 189 213 202   Coag's  Recent Labs Lab 11/26/14 0510  APTT 20*  INR 0.92   BMET  Recent Labs Lab 11/29/14 0448 11/30/14 0344 12/01/14 0600  NA 135 136 134*  K 3.7 3.5 3.4*  CL 104 102 101  CO2 BUN 11 10 21*  CREATININE 0.54 0.54 0.63  GLUCOSE 149* 148* 168*   Electrolytes  Recent Labs Lab 11/29/14 0448 11/30/14 0344 12/01/14 0600  CALCIUM 8.8* 9.1 8.8*   Sepsis Markers No results for input(s): LATICACIDVEN, PROCALCITON, O2SATVEN in the last 168 hours. ABG  Recent Labs Lab 11/30/14 2235  PHART 7.458*  PCO2ART 34.3*  PO2ART 229*   Liver Enzymes  Recent Labs Lab 11/26/14 0510 11/30/14 1520  AST 30 22  ALT 30 23  ALKPHOS 66 56  BILITOT 1.1 1.2  ALBUMIN 4.4 3.8   Cardiac Enzymes No results for input(s): TROPONINI, PROBNP in the last 168 hours. Glucose  Recent Labs Lab 11/26/14 0536 11/30/14 0828  GLUCAP 195* 137*    CXR: mild blunting of L CP angle. Otherwise NACPD    ASSESSMENT / PLAN:  PULMONARY A: VDRF due to AMS P:   Cont full vent support - settings reviewed and/or adjusted Cont vent bundle Daily SBT if/when meets criteria  CARDIOVASCULAR A:  Hyperlipidemia - chronic statin  Htn - chronic beta blocker, ACEI Sinus bradycardia Intermittent Htn P:  DC metoprolol SBP goal < 160 mmHg  RENAL A:   Mild hypokalemia P:   Monitor BMET intermittently Monitor I/Os Correct electrolytes as indicated  GASTROINTESTINAL A:  No issues P:   SUP: enteral famotidine Begin TFs 5/23  HEMATOLOGIC A:   Mild ICU acquired anemia without acute blood loss P:  DVT px: SCDs Monitor CBC intermittently Transfuse per usual ICU guidelines   INFECTIOUS A:   Uncomplicated UTI P:   Complete 5 days Cipro Micro  and abx as above   ENDOCRINE A:   Hyperglycemia without prior dx of DM P:   Monitor glu intermittently Consider SSI for glu > 180  NEUROLOGIC A:  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 Pt was highly active and independent PTA. Daughter expresses desire for DNR in event of cardiac arrest. Further, she wishes that we not escalate our level of intervention from current and that we not provide prolonged mech vent if functional recovery is not likely. i have placed DNR order. We agreed to make daily assessment and consider one way extubation towards end of this week if there is not evidence of substantial improvement by then    CCM time 40 mins   Billy Fischer, MD ; [facility] service Mobile [phone].  After 5:30 PM or weekends, call [phone] Pulmonary and Critical Care Medicine [facility]  12/01/2014, 1:02 PM

## 2014-12-01 NOTE — Progress Notes (Signed)
CBG 148 @ 4:15 pm

## 2014-12-01 NOTE — Progress Notes (Signed)
Chaplain initiated visit with pt daughter, [place]ra [place]ominique [place]. Pt daughter teary at discussing pt condition. Accd to daughter, her  siblings are all on the same page and want to put their mother first. Chaplain encouraged family to page as needed. Chaplain offered to keep pt in prayers; gesture appreciated by family. Chaplain will continue to follow.   12/01/14 1100  Clinical Encounter Type  Visited With Family  Visit Type Initial;Spiritual support  Spiritual Encounters  Spiritual Needs Emotional;Prayer  Stress Factors  Family Stress Factors Major life changes  Taeko Schaffer, Mayer MaskerCourtney F, Chaplain 12/01/2014 11:03 AM

## 2014-12-01 NOTE — Care Management Note (Signed)
Case Management Note  Patient Details  Name: Kathryn Khan MRN: [id number] Date of Birth: 06/30/1946  Subjective/Objective:   Pt admitted on 11/26/14 with intracerebral hemorrhage.  PTA, pt resided at home with spouse and was independent.                   Action/Plan: Pt with new rt SDH; required IVC and intubation over the weekend.  Pt made DNR by family.  Will follow progress.    Expected Discharge Date:                  Expected Discharge Plan:  Skilled Nursing Facility  In-House Referral:  Clinical Social Work  Discharge planning Services  CM Consult  Post Acute Care Choice:    Choice offered to:     DME Arranged:    DME Agency:     HH Arranged:    HH Agency:     Status of Service:  In process, will continue to follow  Medicare Important Message Given:    Date Medicare IM Given:    Medicare IM give by:    Date Additional Medicare IM Given:    Additional Medicare Important Message give by:     If discussed at Long Length of Stay Meetings, dates discussed:    Additional Comments:  Quintella BatonJulie W. Aviana Shevlin, RN, BSN  Trauma/Neuro ICU Case Manager [phone]

## 2014-12-01 NOTE — Progress Notes (Signed)
UR completed.  PT recommending CIR prior to intubation on 5/22.  Will continue to follow progress.    Jerrell BelfastJulie Wise Jarone Ostergaard, RN, BSN [phone]

## 2014-12-01 NOTE — Procedures (Signed)
History: 69 yo F with L BG hemorrhage  Technique: This is a 19 channel routine scalp EEG performed at the bedside with bipolar and monopolar montages arranged in accordance to the international 10/20 system of electrode placement. One channel was dedicated to EKG recording.    Background: There is significant generalized slow activity throughout the recording. There is a posterior dominant rhythm of 8 Hz that attenuates with eye opening. Sleep is recorded with normal appearing structures. There are occasional left parietal sharp waves maximal at P7.   Photic stimulation: Physiologic driving is not performed  EEG Abnormalities: 1) Left parietal sharp waves  Clinical Interpretation: This EEG is consistent with a zone of potential epileptogenicity in teh left parietal region. There was no seizure recorded on this study.   Ritta SlotMcNeill Kirkpatrick, MD [facility] [phone]  If 7pm- 7am, please page neurology on call as listed in AMION.

## 2014-12-01 NOTE — Progress Notes (Signed)
Events of last night noted. Patient unfortunately suffered a delayed intraventricular hemorrhage following placement of a right frontal ventriculostomy. Patient's situation is currently stabilized.  She is currently afebrile. Heart rate and blood pressure are acceptable. Urine output is adequate. Ventriculostomy is functioning draining about 10 mL of bloody CSF per hour. Patient awakens to voice. She moves all 4 extremities but not to command. Purposeful with all 4 extremities.  Follow-up head CT scan pending.  Status post left basal ganglia/internal capsule hemorrhage with secondary intraventricular extension with progressive communicating hydrocephalus. Patient status post ventriculostomy placement yesterday. Initially ventriculostomy improved her level of consciousness but not her speech. Patient suffered a recurrent hemorrhage late last evening either around her colostomy site or a new ventricular hemorrhage. Patient's situation has stabilized [place] and plan for follow-up head CT scan later today. If head CT scan stable may consider intraventricular TPA later in the week.

## 2014-12-01 NOTE — Progress Notes (Signed)
Initial Nutrition Assessment  INTERVENTION:  Initiate Vital AF 1.2 @ 25 ml/hr via OG tube and increase by 10 ml every 4 hours to goal rate of 45 ml/hr.   30 ml Prostat daily.    Tube feeding regimen provides 1396 kcal (100% of needs), 96 grams of protein, and 875 ml of H2O.   NUTRITION DIAGNOSIS:  Inadequate oral intake related to inability to eat as evidenced by NPO status.   GOAL:  Patient will meet greater than or equal to 90% of their needs   MONITOR:  TF tolerance, Vent status, Labs, Weight trends  REASON FOR ASSESSMENT:  Consult Enteral/tube feeding initiation and management  ASSESSMENT:  [age] F adm to stroke service 5/18 with acute L basal ganglia hemorrhage and IVH. Had progressive hydrocephalus and underwent R frontal ventriculostomy 5/22. Catheter clotted requiring replacement. Pt with decreased LOC and intubated 5/22   Patient is currently intubated on ventilator support MV: 6.8 L/min Temp (24hrs), Avg:98.6 F (37 C), Min:97.9 F (36.6 C), Max:99 F (37.2 C)  Pt with OG tube in place, tip in stomach Labs reviewed: Sodium low, Potassium low on replacement Nutrition-Focused physical exam completed. Findings are no fat depletion, no muscle depletion, and no edema.    Height:  Ht Readings from Last 1 Encounters:  11/30/14 5\' 4"  (1.626 m)    Weight:  Wt Readings from Last 1 Encounters:  11/26/14 160 lb 4.4 oz (72.7 kg)    Ideal Body Weight:  54.4 kg  Wt Readings from Last 10 Encounters:  11/26/14 160 lb 4.4 oz (72.7 kg)    BMI:  Body mass index is 27.5 kg/(m^2).  Estimated Nutritional Needs:  Kcal:  1394  Protein:  90-110 grams  Fluid:  > 1.5 L/day  Skin:  Reviewed, no issues  Diet Order:     EDUCATION NEEDS:  No education needs identified at this time   Intake/Output Summary (Last 24 hours) at 12/01/14 1531 Last data filed at 12/01/14 1300  Gross per 24 hour  Intake 2002.67 ml  Output    935 ml  Net 1067.67 ml    Last BM:   5/20  Kendell BaneHeather Carlea Badour RD, LDN, CNSC [phone] Pager [phone] After Hours Pager

## 2014-12-01 NOTE — Progress Notes (Signed)
STROKE TEAM PROGRESS NOTE   SUBJECTIVE (INTERVAL HISTORY) Her daughters and husband are at the bedside. She had EVD placed yesterday afternoon. Unfortunately, she had a intraventricular hemorrhage after EVD placement. She received second EVD placement procedure, and repeat CT showed ventricular cast due to hemorrhage with right SDH. She was intubated and central line placed. Overnight, her condition was stable. This morning she is still intubated, not on sedation. Able to open her eyes to voice, has spontaneous movement on left side, still has right-sided hemiparesis. Will repeat CT head today.   OBJECTIVE Temp:  [97.9 F (36.6 C)-99 F (37.2 C)] 98.8 F (37.1 C) (05/23 1159) Pulse Rate:  [57-107] 59 (05/23 1230) Cardiac Rhythm:  [-] Normal sinus rhythm (05/23 0800) Resp:  [14-25] 15 (05/23 1230) BP: (100-209)/(51-135) 121/61 mmHg (05/23 1230) SpO2:  [93 %-100 %] 99 % (05/23 1230) FiO2 (%):  [40 %-100 %] 40 % (05/23 1159)   Recent Labs Lab 11/26/14 0536 11/30/14 0828  GLUCAP 195* 137*    Recent Labs Lab 11/27/14 1035 11/28/14 0215 11/29/14 0448 11/30/14 0344 12/01/14 0600  NA 131* 133* 135 136 134*  K 3.1* 3.2* 3.7 3.5 3.4*  CL 97* 100* 104 102 101  CO2 GLUCOSE 173* 137* 149* 148* 168*  BUN 21*  CREATININE 0.48 0.61 0.54 0.54 0.63  CALCIUM 8.8* 8.9 8.8* 9.1 8.8*    Recent Labs Lab 11/26/14 0510 11/30/14 1520  AST 30 22  ALT 30 23  ALKPHOS 66 56  BILITOT 1.1 1.2  PROT 7.6 7.0  ALBUMIN 4.4 3.8    Recent Labs Lab 11/26/14 0510  11/27/14 1035 11/28/14 0215 11/29/14 0448 11/30/14 0344 12/01/14 0600  WBC 13.0*  --  8.8 8.5 7.8 6.9 11.6*  NEUTROABS 11.7*  --   --   --   --   --   --   HGB 14.1  < > 13.0 12.1 12.2 12.4 11.7*  HCT 40.1  < > 39.2 36.5 37.4 37.6 35.1*  MCV 97.1  --  97.8 98.1 98.7 98.9 98.0  PLT 209  --  200 205 189 213 202  < > = values in this interval not displayed. No results for input(s): CKTOTAL, CKMB,  CKMBINDEX, TROPONINI in the last 168 hours. No results for input(s): LABPROT, INR in the last 72 hours.  Recent Labs  11/29/14 1730  COLORURINE YELLOW  LABSPEC 1.011  PHURINE 6.0  GLUCOSEU NEGATIVE  HGBUR SMALL*  BILIRUBINUR NEGATIVE  KETONESUR NEGATIVE  PROTEINUR NEGATIVE  UROBILINOGEN 0.2  NITRITE NEGATIVE  LEUKOCYTESUR SMALL*       Component Value Date/Time   CHOL 226* 11/26/2014 1200   TRIG 47 11/26/2014 1200   HDL 78 11/26/2014 1200   CHOLHDL 2.9 11/26/2014 1200   VLDL 9 11/26/2014 1200   LDLCALC 139* 11/26/2014 1200   Lab Results  Component Value Date   HGBA1C 5.7* 11/26/2014   No results found for: LABOPIA, COCAINSCRNUR, LABBENZ, AMPHETMU, THCU, LABBARB  No results for input(s): ETH in the last 168 hours.  I have personally reviewed the radiological images below and agree with the radiology interpretations.  Ct Head (brain) Wo Contrast 12/01/14 - pending  11/30/14 - Interval placement of ventriculostomy catheter via new RIGHT frontal burr hole with distal tip at the foramen of Monro, slightly decreased hydrocephalus. New 6 mm RIGHT holohemispheric acute subdural hematoma, a 4 mm RIGHT to LEFT midline shift (previously 4 mm LEFT to RIGHT midline shift).  Evolving LEFT thalamic intraparenchymal hematoma. Increasing hemorrhagic intraventricular extension.  11/30/14 - Left thalamic hemorrhage and intraventricular hemorrhage unchanged in volume. Progressive ventricular enlargement compatible with obstructive hydrocephalus at the level of the aqueduct.  11/26/14 - Unchanged left thalamic hemorrhage with intraventricular extension.  11/26/2014   IMPRESSION: Acute 3.7 cm bleed in the left basal ganglia with decompression into the ventricles, moderate volume of intraventricular blood. There is 5 mm left-to-right midline shift.     Chest Port 1 View 11/30/14 - No acute chest findings. Mild basilar atelectasis.  11/26/2014    IMPRESSION: Prominence of the pulmonary  interstitial markings and probable small left pleural effusion. Without previous studies it is not possible no with these findings are acute or old. A PA and lateral chest x-ray with deep inspiratory effort would be useful.    2D Echocardiogram  Left ventricle: The cavity size was normal. There was moderate concentric hypertrophy. Systolic function was normal. The estimated ejection fraction was in the range of 55% to 60%. Wall motion was normal; there were no regional wall motion abnormalities. Doppler parameters are consistent with abnormal left ventricular relaxation (grade 1 diastolic dysfunction). - Mitral valve: Mildly to moderately calcified annulus. - Left atrium: The atrium was moderately dilated. Impressions: - No cardiac source of emboli was indentified.  EKG  normal sinus rhythm. For complete results please see formal report.  EEG pending  PHYSICAL EXAM  Temp:  [97.9 F (36.6 C)-99 F (37.2 C)] 98.8 F (37.1 C) (05/23 1159) Pulse Rate:  [57-107] 59 (05/23 1230) Resp:  [14-25] 15 (05/23 1230) BP: (100-209)/(51-135) 121/61 mmHg (05/23 1230) SpO2:  [93 %-100 %] 99 % (05/23 1230) FiO2 (%):  [40 %-100 %] 40 % (05/23 1159)  General - Well nourished, well developed, lethargic and eyes open on voice but not following commands.  Ophthalmologic - not cooperative on exam.  Cardiovascular - Regular rate and rhythm with no murmur.  Neuro - lethargic, eyes open on voice but not following simple commands. Nonverbal. PERRL, eyes attending to both sides, inconsistently blinking to visual threat on the right, right facial droop, tongue in middle in mouth. Right UE 0/5 and RLE no spontaneous movement but 2+/5 on pain stimulation. LUE and LLE spontaneous movement and localizing to pain. Right babinski positive. Reflex 1+. Sensation, coordination and gait not tested.   ASSESSMENT/PLAN Ms. Kathryn Khan is a 69 y.o. female with history of HTN admitted for left BG hemorrhage  with intraventricular extension. Symptoms stabilized and then transferred to floor. Found to have decreased mental status, repeat CT showed hydrocephalus. Transfer back to ICU for EVD placement.  Intraventricular hemorrhage s/p EVD  Currently drainage well from EVD  Dr. Jordan LikesPool is on board  Repeat CT today  Consider intraventricular TPA if bleeding stable  Left BG hemorrhage with intraventricular extension - likely due to hypertensive bleed  CT showed left BG hemorrhage with ventricular extension  Repeat CT showed stable hematoma  Repeat CT head 5/22 showed obstructive hydrocephalus  2D Echo  unremarkable  LDL 139, not at the goal  [id number] 5.7  SCDs  for VTE prophylaxis  no antithrombotic prior to admission, now on no antithrombotic  BP control with goal < 160  Ongoing aggressive stroke risk factor management  Therapy recommendations:  pending  Disposition:  Pending  Obstructive hydrocephalus  Showing on the repeat CT   Likely to explain her lethargy and clinical decline 11/30/14  NSG Dr. Jordan LikesPool performed EVD placement  Currently drainage well  Repeat CT  ?  Seizure   Family report some eye deviation to right and left hand tremor  EEG pending  on keppra for seizure prophylaxis  Hyponatremia  Na 131->132->133->135->136->134  Urine Na 180, consistent with salt wasting syndrome  On oral salt tablet  continue oral salt 2g tid  Close monitoring  Hypertension  Home meds:   Lisinopril, metoprolol Currently on no BP meds BP goal < 160  Stable  Hyperlipidemia  Home meds:  none   Currently on none  LDL 139, goal < 70  on lipitor 20  Continue statin at discharge  UTI  UA showed WBC too numerous to count  Put on cipro  urine cultures E Coli and sensitive to cipro  Other Stroke Risk Factors  Advanced age  ETOH use - 2-3 drinks daily  Other Active Problems  Leukocytosis - resolved  Other Pertinent History  Not check BP at  home  Hospital day # 5  This patient is critically ill due to recurrent intraventricular hemorrhage, left basal ganglia hemorrhage with ventricular extension, respiratory failure and at significant risk of neurological worsening, death form recurrent hemorrhage, obstructive hydrocephalus, cerebral edema, brain herniation, seizure, and cardiopulmonary failure. This patient's care requires constant monitoring of vital signs, hemodynamics, respiratory and cardiac monitoring, review of multiple databases, neurological assessment, discussion with family, other specialists and medical decision making of high complexity. I spent 45 minutes of neurocritical care time in the care of this patient.   [name], MD PhD Stroke Neurology 12/01/2014 12:43 PM   To contact Stroke Continuity provider, please refer to [email or web address]. After hours, contact General Neurology

## 2014-12-01 NOTE — Progress Notes (Signed)
Bedside EEG completed, results pending. 

## 2014-12-01 NOTE — Procedures (Signed)
Oral Intubation Procedure Note   Procedure: Intubation Indications: Respiratory insufficiency Consent: Unable to obtain consent because of altered level of consciousness. Time Out: Verified patient identification, verified procedure, site/side was marked, verified correct patient position, special equipment/implants available, medications/allergies/relevent history reviewed, required imaging and test results available.   Pre-meds: Etomidate 20 mg IV  Neuromuscular blockade: Rocuronium 50 mg IV  Laryngoscope: #3 Glidescope  Visualization: cords fully visualized  ETT: 7.5 ETT passed on first attempt and secured @ 21 cm at upper incisors  Findings: normal upper airway   Evaluation:  CXR revealed proper position of ETT  Pt tolerated procedure well without complications   [name], MD ; [facility] [phone].  After 5:30 PM or weekends, call [phone]

## 2014-12-02 DIAGNOSIS — T8589XA Other specified complication of internal prosthetic devices, implants and grafts, not elsewhere classified, initial encounter: Secondary | ICD-10-CM

## 2014-12-02 DIAGNOSIS — R061 Stridor: Secondary | ICD-10-CM

## 2014-12-02 LAB — BASIC METABOLIC PANEL
Anion gap: 7 (ref 5–15)
BUN: 23 mg/dL — AB (ref 6–20)
CHLORIDE: 109 mmol/L (ref 101–111)
CO2: 23 mmol/L (ref 22–32)
CREATININE: 0.47 mg/dL (ref 0.44–1.00)
Calcium: 8.8 mg/dL — ABNORMAL LOW (ref 8.9–10.3)
GFR calc Af Amer: >60 mL/min (ref >60)
GFR calc non Af Amer: >60 mL/min (ref >60)
Glucose, Bld: 188 mg/dL — ABNORMAL HIGH (ref 65–99)
Potassium: 3.9 mmol/L (ref 3.5–5.1)
Sodium: 139 mmol/L (ref 135–145)

## 2014-12-02 LAB — GLUCOSE, CAPILLARY
GLUCOSE-CAPILLARY: 147 mg/dL — AB (ref 65–99)
Glucose-Capillary: 148 mg/dL — ABNORMAL HIGH (ref 65–99)
Glucose-Capillary: 173 mg/dL — ABNORMAL HIGH (ref 65–99)
Glucose-Capillary: 200 mg/dL — ABNORMAL HIGH (ref 65–99)
Glucose-Capillary: 172 mg/dL — ABNORMAL HIGH (ref 65–99)

## 2014-12-02 LAB — CBC
HCT: 31.9 % — ABNORMAL LOW (ref 36.0–46.0)
Hemoglobin: 10.5 g/dL — ABNORMAL LOW (ref 12.0–15.0)
MCH: 33.1 pg (ref 26.0–34.0)
MCHC: 32.9 g/dL (ref 30.0–36.0)
MCV: 100.6 fL — AB (ref 78.0–100.0)
Platelets: 176 10*3/uL (ref 150–400)
RBC: 3.17 MIL/uL — ABNORMAL LOW (ref 3.87–5.11)
RDW: 12.6 % (ref 11.5–15.5)
WBC: 9.3 10*3/uL (ref 4.0–10.5)

## 2014-12-02 MED ORDER — INSULIN ASPART 100 UNIT/ML ~~LOC~~ SOLN
0.0000 [IU] | SUBCUTANEOUS | Status: DC
  Administered 2014-12-02: 1 [IU] via SUBCUTANEOUS
  Administered 2014-12-02 (×2): 2 [IU] via SUBCUTANEOUS
  Administered 2014-12-03: 3 [IU] via SUBCUTANEOUS
  Administered 2014-12-03: 1 [IU] via SUBCUTANEOUS
  Administered 2014-12-03 (×4): 2 [IU] via SUBCUTANEOUS
  Administered 2014-12-04: 5 [IU] via SUBCUTANEOUS
  Administered 2014-12-04 (×2): 2 [IU] via SUBCUTANEOUS
  Administered 2014-12-04 (×3): 3 [IU] via SUBCUTANEOUS

## 2014-12-02 MED ORDER — CHLORHEXIDINE GLUCONATE 0.12 % MT SOLN
OROMUCOSAL | Status: AC
  Administered 2014-12-02: 15 mL
  Filled 2014-12-02: qty 15

## 2014-12-02 MED ORDER — HYDRALAZINE HCL 25 MG PO TABS
25.0000 mg | ORAL_TABLET | Freq: Three times a day (TID) | ORAL | Status: DC
  Administered 2014-12-03 – 2014-12-04 (×2): 25 mg
  Filled 2014-12-02 (×9): qty 1

## 2014-12-02 MED ORDER — RACEPINEPHRINE HCL 2.25 % IN NEBU
INHALATION_SOLUTION | RESPIRATORY_TRACT | Status: AC
  Filled 2014-12-02: qty 0.5

## 2014-12-02 MED ORDER — METHYLPREDNISOLONE SODIUM SUCC 125 MG IJ SOLR
80.0000 mg | Freq: Once | INTRAMUSCULAR | Status: AC
  Administered 2014-12-02: 80 mg via INTRAVENOUS

## 2014-12-02 MED ORDER — LORAZEPAM 2 MG/ML IJ SOLN: 0.5000 mg | Freq: Once | INTRAMUSCULAR | Status: AC

## 2014-12-02 MED ORDER — RACEPINEPHRINE HCL 2.25 % IN NEBU
0.5000 mL | INHALATION_SOLUTION | Freq: Once | RESPIRATORY_TRACT | Status: AC
  Administered 2014-12-02: 0.5 mL via RESPIRATORY_TRACT

## 2014-12-02 MED ORDER — LISINOPRIL 20 MG PO TABS
20.0000 mg | ORAL_TABLET | Freq: Every day | ORAL | Status: DC
  Filled 2014-12-02: qty 1

## 2014-12-02 MED ORDER — FENTANYL CITRATE (PF) 100 MCG/2ML IJ SOLN
12.5000 ug | INTRAMUSCULAR | Status: DC | PRN
  Administered 2014-12-02 – 2014-12-10 (×15): 25 ug via INTRAVENOUS
  Filled 2014-12-02 (×16): qty 2

## 2014-12-02 MED ORDER — METHYLPREDNISOLONE SODIUM SUCC 40 MG IJ SOLR
40.0000 mg | Freq: Two times a day (BID) | INTRAMUSCULAR | Status: DC
  Administered 2014-12-02 – 2014-12-03 (×3): 40 mg via INTRAVENOUS
  Filled 2014-12-02 (×5): qty 1

## 2014-12-02 MED ORDER — FENTANYL CITRATE (PF) 100 MCG/2ML IJ SOLN
INTRAMUSCULAR | Status: AC
  Filled 2014-12-02: qty 4

## 2014-12-02 MED ORDER — CLEVIDIPINE BUTYRATE 0.5 MG/ML IV EMUL
0.0000 mg/h | INTRAVENOUS | Status: DC
  Administered 2014-12-02: 1 mg/h via INTRAVENOUS
  Administered 2014-12-02: 3 mg/h via INTRAVENOUS
  Administered 2014-12-02: 4 mg/h via INTRAVENOUS
  Administered 2014-12-03 (×2): 3 mg/h via INTRAVENOUS
  Administered 2014-12-03: 5 mg/h via INTRAVENOUS
  Administered 2014-12-03 – 2014-12-04 (×3): 3 mg/h via INTRAVENOUS
  Administered 2014-12-04: 1 mg/h via INTRAVENOUS
  Administered 2014-12-04 – 2014-12-05 (×2): 4 mg/h via INTRAVENOUS
  Administered 2014-12-05: 3 mg/h via INTRAVENOUS
  Filled 2014-12-02 (×181): qty 50

## 2014-12-02 MED ORDER — MIDAZOLAM HCL 2 MG/2ML IJ SOLN
INTRAMUSCULAR | Status: AC
  Filled 2014-12-02: qty 4

## 2014-12-02 MED ORDER — HYDRALAZINE HCL 25 MG PO TABS
25.0000 mg | ORAL_TABLET | Freq: Three times a day (TID) | ORAL | Status: DC
  Filled 2014-12-02 (×4): qty 1

## 2014-12-02 MED ORDER — LORAZEPAM 2 MG/ML IJ SOLN
INTRAMUSCULAR | Status: AC
  Administered 2014-12-02: 0.5 mg
  Filled 2014-12-02: qty 1

## 2014-12-02 MED ORDER — RACEPINEPHRINE HCL 2.25 % IN NEBU
INHALATION_SOLUTION | RESPIRATORY_TRACT | Status: AC
  Administered 2014-12-02: 0.5 mL
  Filled 2014-12-02: qty 0.5

## 2014-12-02 MED ORDER — RACEPINEPHRINE HCL 2.25 % IN NEBU
0.5000 mL | INHALATION_SOLUTION | Freq: Once | RESPIRATORY_TRACT | Status: DC
  Filled 2014-12-02: qty 0.5

## 2014-12-02 MED ORDER — METHYLPREDNISOLONE SODIUM SUCC 125 MG IJ SOLR
80.0000 mg | Freq: Once | INTRAMUSCULAR | Status: AC
  Filled 2014-12-02: qty 2

## 2014-12-02 NOTE — Progress Notes (Signed)
MD and RT and RNs at bedside. Pt appears more comfortable, unlabored in after racemic epi and IV methylpred.. Will cont to monitor closely.

## 2014-12-02 NOTE — Progress Notes (Addendum)
Pt with moderate amt of distress, resp rate 30 and labored. Ronchi bilaterally, O2 sat 100 %. RT to bedside to evaluate. Will monitor closely. MD Simonds notified of change in pts status at 10:30.

## 2014-12-02 NOTE — Progress Notes (Signed)
Rehab admissions - currently following along for medical readiness for inpatient rehab admission.  Noted patient off vent and on Bipap now.  Call me for questions.  #[phone]

## 2014-12-02 NOTE — Progress Notes (Signed)
Dr. Roda ShuttersXu contacted concerning patient's abrupt increase in blood pressure (SBP>180) and unsuccessful resolution from prescribed medications. Order given to start Cardene to titrate for a SBP between 140-160. Will continue to monitor.

## 2014-12-02 NOTE — Progress Notes (Signed)
IVC placed earlier by Dr. Jordan LikesPool, which stopped draining due to occlusion from blood. Unable to regain patency from interventions. Dr. Wynetta Emeryram contacted concerning event and interventions. Dr. Wynetta Emeryram to attempt irrigation, which was unsuccessful, where another IVC was placed. Patient tolerated procedure well.  Will continue to monitor.

## 2014-12-02 NOTE — Progress Notes (Signed)
[facility] contacted and Dr. Katrinka BlazingSmith assessed patient for concerns to maintain airway due to decreased level of consciousness and concerns for adequate ventilation. Upon evaluation, Dr. Sung AmabileSimonds was contacted to emergently intubate patient to maintain airway and adequate ventilation. Will continue to monitor patient closely.

## 2014-12-02 NOTE — Progress Notes (Signed)
STROKE TEAM PROGRESS NOTE   SUBJECTIVE (INTERVAL HISTORY) Her daughters and husband are at the bedside. Her EVD drainage continues with about 10cc/h. Still bloody but better than yesterday. More awake alert today. CT yesterday showed better IVH and hydrocephalus. Consider extubation if able.    OBJECTIVE Temp:  [97.9 F (36.6 C)-99 F (37.2 C)] 98.8 F (37.1 C) (05/24 1507) Pulse Rate:  [55-102] 81 (05/24 1413) Cardiac Rhythm:  [-] Normal sinus rhythm (05/24 0900) Resp:  [14-31] 21 (05/24 1413) BP: (104-175)/(43-87) 142/65 mmHg (05/24 1413) SpO2:  [95 %-100 %] 100 % (05/24 1413) FiO2 (%):  [40 %] 40 % (05/24 0400)   Recent Labs Lab 11/30/14 0828 12/01/14 1618 12/02/14 0003 12/02/14 0725 12/02/14 1116  GLUCAP 137* 148* 173* 200* 147*    Recent Labs Lab 11/28/14 0215 11/29/14 0448 11/30/14 0344 12/01/14 0600 12/02/14 0415  NA 133* 135 136 134* 139  K 3.2* 3.7 3.5 3.4* 3.9  CL 100* 104 102 101 109  CO2 GLUCOSE 137* 149* 148* 168* 188*  BUN 21* 23*  CREATININE 0.61 0.54 0.54 0.63 0.47  CALCIUM 8.9 8.8* 9.1 8.8* 8.8*    Recent Labs Lab 11/26/14 0510 11/30/14 1520  AST 30 22  ALT 30 23  ALKPHOS 66 56  BILITOT 1.1 1.2  PROT 7.6 7.0  ALBUMIN 4.4 3.8    Recent Labs Lab 11/26/14 0510  11/28/14 0215 11/29/14 0448 11/30/14 0344 12/01/14 0600 12/02/14 0415  WBC 13.0*  < > 8.5 7.8 6.9 11.6* 9.3  NEUTROABS 11.7*  --   --   --   --   --   --   HGB 14.1  < > 12.1 12.2 12.4 11.7* 10.5*  HCT 40.1  < > 36.5 37.4 37.6 35.1* 31.9*  MCV 97.1  < > 98.1 98.7 98.9 98.0 100.6*  PLT 209  < > 205 189 213 202 176  < > = values in this interval not displayed. No results for input(s): CKTOTAL, CKMB, CKMBINDEX, TROPONINI in the last 168 hours. No results for input(s): LABPROT, INR in the last 72 hours.  Recent Labs  11/29/14 1730  COLORURINE YELLOW  LABSPEC 1.011  PHURINE 6.0  GLUCOSEU NEGATIVE  HGBUR SMALL*  BILIRUBINUR NEGATIVE  KETONESUR  NEGATIVE  PROTEINUR NEGATIVE  UROBILINOGEN 0.2  NITRITE NEGATIVE  LEUKOCYTESUR SMALL*       Component Value Date/Time   CHOL 226* 11/26/2014 1200   TRIG 47 11/26/2014 1200   HDL 78 11/26/2014 1200   CHOLHDL 2.9 11/26/2014 1200   VLDL 9 11/26/2014 1200   LDLCALC 139* 11/26/2014 1200   Lab Results  Component Value Date   HGBA1C 5.7* 11/26/2014   No results found for: LABOPIA, COCAINSCRNUR, LABBENZ, AMPHETMU, THCU, LABBARB  No results for input(s): ETH in the last 168 hours.  I have personally reviewed the radiological images below and agree with the radiology interpretations.  Ct Head (brain) Wo Contrast 12/01/14 - When compared to the most recent examination, minimal decrease in amount of pneumocephalus and minimal increase in size of hematoma centered in the left thalamic region as detailed above. The amount of intraventricular blood, size of the broad-based right subdural hematoma and degree of enlargement of the lateral ventricles is relatively similar to most recent exam.  11/30/14 - Interval placement of ventriculostomy catheter via new RIGHT frontal burr hole with distal tip at the foramen of Monro, slightly decreased hydrocephalus. New 6 mm RIGHT holohemispheric acute subdural hematoma,  a 4 mm RIGHT to LEFT midline shift (previously 4 mm LEFT to RIGHT midline shift). Evolving LEFT thalamic intraparenchymal hematoma. Increasing hemorrhagic intraventricular extension.  11/30/14 - Left thalamic hemorrhage and intraventricular hemorrhage unchanged in volume. Progressive ventricular enlargement compatible with obstructive hydrocephalus at the level of the aqueduct.  11/26/14 - Unchanged left thalamic hemorrhage with intraventricular extension.  11/26/2014   IMPRESSION: Acute 3.7 cm bleed in the left basal ganglia with decompression into the ventricles, moderate volume of intraventricular blood. There is 5 mm left-to-right midline shift.     Chest Port 1 View 11/30/14 - No  acute chest findings. Mild basilar atelectasis.  11/26/2014    IMPRESSION: Prominence of the pulmonary interstitial markings and probable small left pleural effusion. Without previous studies it is not possible no with these findings are acute or old. A PA and lateral chest x-ray with deep inspiratory effort would be useful.    2D Echocardiogram  Left ventricle: The cavity size was normal. There was moderate concentric hypertrophy. Systolic function was normal. The estimated ejection fraction was in the range of 55% to 60%. Wall motion was normal; there were no regional wall motion abnormalities. Doppler parameters are consistent with abnormal left ventricular relaxation (grade 1 diastolic dysfunction). - Mitral valve: Mildly to moderately calcified annulus. - Left atrium: The atrium was moderately dilated. Impressions: - No cardiac source of emboli was indentified.  EKG  normal sinus rhythm. For complete results please see formal report.  EEG This EEG is consistent with a zone of potential epileptogenicity in the left parietal region. There was no seizure recorded on this study.   PHYSICAL EXAM  Temp:  [97.9 F (36.6 C)-99 F (37.2 C)] 98.8 F (37.1 C) (05/24 1507) Pulse Rate:  [55-102] 81 (05/24 1413) Resp:  [14-31] 21 (05/24 1413) BP: (104-175)/(43-87) 142/65 mmHg (05/24 1413) SpO2:  [95 %-100 %] 100 % (05/24 1413) FiO2 (%):  [40 %] 40 % (05/24 0400)  General - Well nourished, well developed, intubated and not on sedation, eye open.  Ophthalmologic - not cooperative on exam.  Cardiovascular - Regular rate and rhythm with no murmur.  Neuro - more awake alert today, eyes open spontaneously, but not following simple commands. PERRL, eyes left gaze preference, not blinking to visual threat on the right, right facial droop. Right UE 0/5 and RLE no spontaneous movement but 2+/5 on pain stimulation. LUE and LLE spontaneous movement and localizing to pain. Right babinski  positive. Reflex 1+. Sensation, coordination and gait not tested.  ASSESSMENT/PLAN Kathryn Khan is a 69 y.o. female with history of HTN admitted for left BG hemorrhage with intraventricular extension. Symptoms stabilized and then transferred to floor. Found to have decreased mental status, repeat CT showed hydrocephalus. Transfer back to ICU for EVD placement.  Intraventricular hemorrhage s/p EVD  Currently drainage well from EVD  Dr. Jordan Likes is on board  Repeat CT tomorrow  Consider intraventricular TPA depending on the CT result tomorrow.  Left BG hemorrhage with intraventricular extension - likely due to hypertensive bleed  CT showed left BG hemorrhage with ventricular extension  Repeat CT showed stable hematoma  Repeat CT head 5/22 showed obstructive hydrocephalus  2D Echo  unremarkable  LDL 139, not at the goal  NWGN5A 5.7  SCDs  for VTE prophylaxis  no antithrombotic prior to admission, now on no antithrombotic  BP control with goal < 160  Ongoing aggressive stroke risk factor management  Therapy recommendations:  pending  Disposition:  Pending  Obstructive hydrocephalus  Showing on the repeat CT   Likely to explain her lethargy and clinical decline 11/30/14  EVD placed  Currently drainage well  Repeat CT showed better hydrocephalus  Will repeat CT again in am  ? Seizure   Family report some eye deviation to right and left hand tremor  EEG left parietal sharps. 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, consistent with salt wasting syndrome  On oral salt tablet  continue oral salt 2g tid  Close monitoring  Hypertension  Home meds:   Lisinopril, metoprolol BP still high Put on cleviprex drip Add lisinopril and hydralazine BP goal < 160  Hyperlipidemia  Home meds:  none   Currently on none  LDL 139, goal < 70  on lipitor 20  Continue statin  at discharge  UTI  UA showed WBC too numerous to count  Put on cipro  urine cultures E Coli and sensitive to cipro  Other Stroke Risk Factors  Advanced age  ETOH use - 2-3 drinks daily  Other Active Problems  Leukocytosis - resolved  Other Pertinent History  Not check BP at home  Hospital day # 6  This patient is critically ill due to recurrent intraventricular hemorrhage, left basal ganglia hemorrhage with ventricular extension, respiratory failure and at significant risk of neurological worsening, death form recurrent hemorrhage, obstructive hydrocephalus, cerebral edema, brain herniation, seizure, and cardiopulmonary failure. This patient's care requires constant monitoring of vital signs, hemodynamics, respiratory and cardiac monitoring, review of multiple databases, neurological assessment, discussion with family, other specialists and medical decision making of high complexity. I spent 40 minutes of neurocritical care time in the care of this patient.   Marvel PlanJindong Emmy Keng, MD PhD Stroke Neurology 12/02/2014 3:21 PM   To contact Stroke Continuity provider, please refer to [email or web address]. After hours, contact General Neurology

## 2014-12-02 NOTE — Consult Note (Signed)
PULMONARY / CRITICAL CARE MEDICINE   Name: Kathryn Khan MRN: [id number] DOB: 1945/10/13    ADMISSION DATE:  11/26/2014 CONSULTATION DATE:  5/23  REFERRING MD :  Tora Kindred   INITIAL PRESENTATION:  68 F adm to stroke service 5/18 with acute L basal ganglia hemorrhage and IVH. Had progressive hydrocephalus and underwent R frontal ventriculostomy 5/22. Catheter clotted requiring replacement. Progressive obtundation required ETI 5/22 PM. 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 R hemiplegia and aphasia on exam 5/18 CT head: Acute 3.7 cm bleed in the left basal ganglia with decompression into the ventricles, moderate volume of intraventricular blood. There is 5 mm left-to-right midline shift 5/18 repeat CT head: Unchanged left thalamic hemorrhage with intraventricular extension. 5/19 More awake alert and can repeat with answering some questions appropriately but severe dysarthria 5/20 Overall stable 5/21 Increased BP. Metoprolol initiated 5/22 AM: Rapid response call for decreased LOC. Low grade fever. Pyuria. Ciprofloxacin initiated 5/22 Stat CT head: Left thalamic hemorrhage and intraventricular hemorrhage unchanged in volume. 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 R frontal ventriculostomy placed  5/22 Intubated for depressed LOC 5/22 ventric drain malfunction. IVC replaced [name]) 5/22 repeat CT head: Stable size of the left thalamic hematoma. Stable vasogenic edema and stable mild midline shift. Stable obstructive hydrocephalus with blood in the cerebral aqueduct 5/23 CT head: minimal decrease in amount of pneumocephalus and minimal increase in size of hematoma centered in the left thalamic region as detailed above. The amount of intraventricular blood, size of the broad-based right subdural hematoma  and degree of enlargement of the lateral ventricles is relatively similar to most recent exam 5/24 LOC improved. Passed SBT. Developed stridor approx one hour after extubation. Received nebulized racemic epi X 2 and methylpred initiated. PRN NPPV  INDWELLING DEVICES:: ETT 5/22 >> 5/24 R IJ CVL 5/22 >>   MICRO DATA: MRSA PCR 5/18 >> NEG Urine 5/19 >> E coli C diff PCR 5/20 >> NEG  ANTIMICROBIALS:  Cipro 5/19 >> 5/24   SUBJECTIVE:  RASS 0. Not F/C. Appears aphasic   VITAL SIGNS: Temp:  [97.9 F (36.6 C)-99 F (37.2 C)] 97.9 F (36.6 C) (05/24 0727) Pulse Rate:  [55-102] 96 (05/24 1130) Resp:  [14-31] 20 (05/24 1130) BP: (104-175)/(43-80) 160/64 mmHg (05/24 1130) SpO2:  [95 %-100 %] 99 % (05/24 1130) FiO2 (%):  [40 %] 40 % (05/24 0400) HEMODYNAMICS:   VENTILATOR SETTINGS: Vent Mode:  [-] PSV;CPAP FiO2 (%):  [40 %] 40 % Set Rate:  [14 bmp] 14 bmp PEEP:  [5 cmH20] 5 cmH20 Pressure Support:  [5 cmH20] 5 cmH20 Plateau Pressure:  [13 cmH20-14 cmH20] 14 cmH20 INTAKE / OUTPUT:  Intake/Output Summary (Last 24 hours) at 12/02/14 1215 Last data filed at 12/02/14 1200  Gross per 24 hour  Intake 2082.5 ml  Output   1686 ml  Net  396.5 ml    PHYSICAL EXAMINATION: General: WDWN, intubated, NAD Neuro:  EOMI, PERRL, R hemiplegia but moving RLE some HEENT: NCAT Cardiovascular: Huston Foley, reg, no M Lungs: Clear Abdomen: Soft, NT, +BS Ext: warm, no edema  LABS:  CBC  Recent Labs Lab 11/30/14 0344 12/01/14 0600 12/02/14 0415  WBC 6.9 11.6* 9.3  HGB 12.4 11.7* 10.5*  HCT 37.6 35.1* 31.9*  PLT 213 202 176   Coag's  Recent Labs Lab 11/26/14 0510  APTT 20*  INR  0.92   BMET  Recent Labs Lab 11/30/14 0344 12/01/14 0600 12/02/14 0415  NA 136 134* 139  K 3.5 3.4* 3.9  CL 102 101 109  CO2 26 27 23   BUN 10 21* 23*  CREATININE 0.54 0.63 0.47  GLUCOSE 148* 168* 188*   Electrolytes  Recent Labs Lab 11/30/14 0344 12/01/14 0600 12/02/14 0415  CALCIUM 9.1  8.8* 8.8*   Sepsis Markers No results for input(s): LATICACIDVEN, PROCALCITON, O2SATVEN in the last 168 hours. ABG  Recent Labs Lab 11/30/14 2235  PHART 7.458*  PCO2ART 34.3*  PO2ART 229*   Liver Enzymes  Recent Labs Lab 11/26/14 0510 11/30/14 1520  AST 30 22  ALT 30 23  ALKPHOS 66 56  BILITOT 1.1 1.2  ALBUMIN 4.4 3.8   Cardiac Enzymes No results for input(s): TROPONINI, PROBNP in the last 168 hours. Glucose  Recent Labs Lab 11/26/14 0536 11/30/14 0828 12/01/14 1618 12/02/14 0003 12/02/14 0725 12/02/14 1116  GLUCAP 195* 137* 148* 173* 200* 147*    CXR: NNF    ASSESSMENT / PLAN:  PULMONARY A: VDRF due to AMS Post extubation stridor P:   O2 by face tent Methylpred 5/24 PRN NPPV Monitor closely for need for re-inutbation  CARDIOVASCULAR A:  Hyperlipidemia - chronic statin  Htn - chronic beta blocker, ACEI Sinus bradycardia Intermittent Htn P:  DC metoprolol SBP goal < 160 mmHg Back on clevidipine per stroke team  RENAL A:   Mild hypokalemia, resolved P:   Monitor BMET intermittently Monitor I/Os Correct electrolytes as indicated  GASTROINTESTINAL A:  Dysphagia P:   SUP: enteral famotidine Cont TFs  HEMATOLOGIC A:   Mild ICU acquired anemia without acute blood loss P:  DVT px: SCDs Monitor CBC intermittently Transfuse per usual ICU guidelines  INFECTIOUS A:   Uncomplicated UTI P:   Micro and abx as above   ENDOCRINE A:   Hyperglycemia without prior dx of DM  Likely to be exacerbated by steroids P:   Monitor glu intermittently Sens scale SSI  NEUROLOGIC A:  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  After 5:30 PM or weekends, call [phone] Pulmonary and Critical Care Medicine [facility]  12/02/2014, 12:15 PM

## 2014-12-02 NOTE — Progress Notes (Addendum)
Pt is resting at this time. Aerosol Face Tent 40%. Vitals: HR: 100, RR: 24, Sats: 100%. No Stridor. WOB normal, No SOB at this time. BIPAP at bedside if needed. RN aware to call RT if needed. RT will monitor.

## 2014-12-02 NOTE — Progress Notes (Signed)
Patient much more awake and aware today. Moving left upper extremity strongly and occasionally following commands. Still weaker with right upper extremity. Lower extremity strength intact. Continue ostomy working well. Output still bloody. Patient has been extubated recently but is struggling somewhat and may require reintubation. Plan follow-up head CT scan tomorrow with determination as to whether to begin intraventricular TPA

## 2014-12-02 NOTE — Procedures (Signed)
Extubation Procedure Note  Patient Details:   Name: Kathryn Khan DOB: 06/30/1946 MRN: [id number]   Airway Documentation: patient had audible cuff leak with cuff deflated and was extubated to 4lpm [place].  Tolerated well and will continur to monitor.  O2 sat 99%   Evaluation  O2 sats: stable throughout Complications: No apparent complications Patient did not tolerate procedure well. Bilateral Breath Sounds: Rhonchi Suctioning: Oral No patient unable to verbally communicate. Patient did cough upon extubation.  Elbert EwingsHall, Deontray Hunnicutt [place] 12/02/2014, 9:38 AM

## 2014-12-03 ENCOUNTER — Inpatient Hospital Stay (HOSPITAL_COMMUNITY): Payer: Medicare HMO

## 2014-12-03 DIAGNOSIS — Z4659 Encounter for fitting and adjustment of other gastrointestinal appliance and device: Secondary | ICD-10-CM | POA: Insufficient documentation

## 2014-12-03 DIAGNOSIS — Z87898 Personal history of other specified conditions: Secondary | ICD-10-CM

## 2014-12-03 DIAGNOSIS — R061 Stridor: Secondary | ICD-10-CM | POA: Insufficient documentation

## 2014-12-03 DIAGNOSIS — T8589XD Other specified complication of internal prosthetic devices, implants and grafts, not elsewhere classified, subsequent encounter: Secondary | ICD-10-CM

## 2014-12-03 DIAGNOSIS — G911 Obstructive hydrocephalus: Secondary | ICD-10-CM | POA: Insufficient documentation

## 2014-12-03 DIAGNOSIS — I639 Cerebral infarction, unspecified: Secondary | ICD-10-CM | POA: Insufficient documentation

## 2014-12-03 DIAGNOSIS — I10 Essential (primary) hypertension: Secondary | ICD-10-CM | POA: Insufficient documentation

## 2014-12-03 DIAGNOSIS — I615 Nontraumatic intracerebral hemorrhage, intraventricular: Secondary | ICD-10-CM | POA: Insufficient documentation

## 2014-12-03 LAB — URINALYSIS W MICROSCOPIC (NOT AT ARMC)
Bilirubin Urine: NEGATIVE
GLUCOSE, UA: 100 mg/dL — AB
KETONES UR: 40 mg/dL — AB
Leukocytes, UA: NEGATIVE
NITRITE: NEGATIVE
PROTEIN: NEGATIVE mg/dL
Specific Gravity, Urine: 1.014 (ref 1.005–1.030)
UROBILINOGEN UA: 1 mg/dL (ref 0.0–1.0)
pH: 6 (ref 5.0–8.0)

## 2014-12-03 LAB — BASIC METABOLIC PANEL
Anion gap: 11 (ref 5–15)
BUN: 14 mg/dL (ref 6–20)
CHLORIDE: 98 mmol/L — AB (ref 101–111)
CO2: 27 mmol/L (ref 22–32)
Calcium: 9.3 mg/dL (ref 8.9–10.3)
Creatinine, Ser: 0.5 mg/dL (ref 0.44–1.00)
GFR calc Af Amer: >60 mL/min (ref >60)
GFR calc non Af Amer: >60 mL/min (ref >60)
GLUCOSE: 175 mg/dL — AB (ref 65–99)
Potassium: 3.2 mmol/L — ABNORMAL LOW (ref 3.5–5.1)
SODIUM: 136 mmol/L (ref 135–145)

## 2014-12-03 LAB — GLUCOSE, CAPILLARY
GLUCOSE-CAPILLARY: 154 mg/dL — AB (ref 65–99)
GLUCOSE-CAPILLARY: 159 mg/dL — AB (ref 65–99)
GLUCOSE-CAPILLARY: 171 mg/dL — AB (ref 65–99)
GLUCOSE-CAPILLARY: 189 mg/dL — AB (ref 65–99)
Glucose-Capillary: 126 mg/dL — ABNORMAL HIGH (ref 65–99)

## 2014-12-03 LAB — CBC
HCT: 35.1 % — ABNORMAL LOW (ref 36.0–46.0)
HEMOGLOBIN: 11.9 g/dL — AB (ref 12.0–15.0)
MCH: 33.1 pg (ref 26.0–34.0)
MCHC: 33.9 g/dL (ref 30.0–36.0)
MCV: 97.8 fL (ref 78.0–100.0)
PLATELETS: 227 10*3/uL (ref 150–400)
RBC: 3.59 MIL/uL — ABNORMAL LOW (ref 3.87–5.11)
RDW: 12.1 % (ref 11.5–15.5)
WBC: 8.8 10*3/uL (ref 4.0–10.5)

## 2014-12-03 MED ORDER — ALTEPLASE 100 MG IV SOLR
2.0000 mg | Freq: Once | INTRAVENOUS | Status: AC
  Administered 2014-12-04: 2 mg
  Filled 2014-12-03: qty 2

## 2014-12-03 MED ORDER — RACEPINEPHRINE HCL 2.25 % IN NEBU
0.5000 mL | INHALATION_SOLUTION | Freq: Once | RESPIRATORY_TRACT | Status: AC
  Administered 2014-12-03: 0.5 mL via RESPIRATORY_TRACT
  Filled 2014-12-03: qty 0.5

## 2014-12-03 MED ORDER — POTASSIUM CHLORIDE 10 MEQ/50ML IV SOLN
10.0000 meq | INTRAVENOUS | Status: AC
  Administered 2014-12-03 (×6): 10 meq via INTRAVENOUS
  Filled 2014-12-03 (×6): qty 50

## 2014-12-03 MED ORDER — CHLORHEXIDINE GLUCONATE 0.12 % MT SOLN
15.0000 mL | Freq: Two times a day (BID) | OROMUCOSAL | Status: DC
  Administered 2014-12-03 – 2014-12-10 (×14): 15 mL via OROMUCOSAL
  Filled 2014-12-03 (×12): qty 15

## 2014-12-03 MED ORDER — LIDOCAINE HCL 2 % EX GEL
1.0000 "application " | Freq: Once | CUTANEOUS | Status: DC
  Filled 2014-12-03 (×2): qty 5

## 2014-12-03 MED ORDER — CETYLPYRIDINIUM CHLORIDE 0.05 % MT LIQD
7.0000 mL | Freq: Two times a day (BID) | OROMUCOSAL | Status: DC
  Administered 2014-12-03 – 2014-12-11 (×15): 7 mL via OROMUCOSAL

## 2014-12-03 MED ORDER — CETYLPYRIDINIUM CHLORIDE 0.05 % MT LIQD
7.0000 mL | Freq: Two times a day (BID) | OROMUCOSAL | Status: DC
  Administered 2014-12-03: 7 mL via OROMUCOSAL

## 2014-12-03 MED ORDER — LISINOPRIL 20 MG PO TABS
20.0000 mg | ORAL_TABLET | Freq: Every day | ORAL | Status: DC
  Administered 2014-12-03: 20 mg via ORAL
  Filled 2014-12-03 (×2): qty 1

## 2014-12-03 MED ORDER — LIDOCAINE HCL 2 % EX GEL
1.0000 "application " | Freq: Once | CUTANEOUS | Status: AC
  Administered 2014-12-03: 1 via TOPICAL
  Filled 2014-12-03: qty 5

## 2014-12-03 NOTE — Progress Notes (Signed)
Pt is resting comfortably at this time. Vitals: HR: 88, RR 26, Sats 95%. Pt is wearing 2L [place]. BIPAP available at bedside if needed. RT will monitor.

## 2014-12-03 NOTE — Progress Notes (Signed)
STROKE TEAM PROGRESS NOTE   SUBJECTIVE (INTERVAL HISTORY) Her daughters are at the bedside. Her EVD drainage continues with about 5-12cc/h. Still bloody. Continue to be more awake alert today. CT repeat showed similar IVH and hydrocephalus. Consider tPA intraventricular tomorrow. Pt was extubated yesterday but has several episodes of strider. Pt was given Epi overnight. On solumedrol now.   OBJECTIVE Temp:  [98.4 F (36.9 C)-99 F (37.2 C)] 98.9 F (37.2 C) (05/25 0733) Pulse Rate:  [38-108] 103 (05/25 1400) Cardiac Rhythm:  [-] Normal sinus rhythm (05/25 0740) Resp:  [17-29] 23 (05/25 1400) BP: (133-179)/(55-104) 176/84 mmHg (05/25 1400) SpO2:  [93 %-100 %] 98 % (05/25 1400) FiO2 (%):  [40 %] 40 % (05/25 0740)   Recent Labs Lab 12/02/14 1939 12/03/14 0008 12/03/14 0356 12/03/14 0729 12/03/14 1132  GLUCAP 189* 154* 171* 159* 126*    Recent Labs Lab 11/29/14 0448 11/30/14 0344 12/01/14 0600 12/02/14 0415 12/03/14 0520  NA 135 136 134* 139 136  K 3.7 3.5 3.4* 3.9 3.2*  CL 104 102 101 109 98*  CO2 GLUCOSE 149* 148* 168* 188* 175*  BUN 11 10 21* 23* 14  CREATININE 0.54 0.54 0.63 0.47 0.50  CALCIUM 8.8* 9.1 8.8* 8.8* 9.3    Recent Labs Lab 11/30/14 1520  AST 22  ALT 23  ALKPHOS 56  BILITOT 1.2  PROT 7.0  ALBUMIN 3.8    Recent Labs Lab 11/29/14 0448 11/30/14 0344 12/01/14 0600 12/02/14 0415 12/03/14 0520  WBC 7.8 6.9 11.6* 9.3 8.8  HGB 12.2 12.4 11.7* 10.5* 11.9*  HCT 37.4 37.6 35.1* 31.9* 35.1*  MCV 98.7 98.9 98.0 100.6* 97.8  PLT 189 213 202 176 227   No results for input(s): CKTOTAL, CKMB, CKMBINDEX, TROPONINI in the last 168 hours. No results for input(s): LABPROT, INR in the last 72 hours. No results for input(s): COLORURINE, LABSPEC, PHURINE, GLUCOSEU, HGBUR, BILIRUBINUR, KETONESUR, PROTEINUR, UROBILINOGEN, NITRITE, LEUKOCYTESUR in the last 72 hours.  Invalid input(s): APPERANCEUR     Component Value Date/Time   CHOL 226*  11/26/2014 1200   TRIG 47 11/26/2014 1200   HDL 78 11/26/2014 1200   CHOLHDL 2.9 11/26/2014 1200   VLDL 9 11/26/2014 1200   LDLCALC 139* 11/26/2014 1200   Lab Results  Component Value Date   HGBA1C 5.7* 11/26/2014   No results found for: LABOPIA, COCAINSCRNUR, LABBENZ, AMPHETMU, THCU, LABBARB  No results for input(s): ETH in the last 168 hours.  I have personally reviewed the radiological images below and agree with the radiology interpretations.  Ct Head (brain) Wo Contrast 12/03/14 - Evolving LEFT thalamic hematoma with intraventricular extension, similar intraventricular blood products, mild improved residual hydrocephalus with RIGHT frontal ventriculostomy catheter in place. Similar blood products along the catheter tract. Stable small RIGHT holohemispheric subdural hematoma measuring up to 9 mm. Similar 4 mm RIGHT to LEFT midline shift.  12/01/14 - When compared to the most recent examination, minimal decrease in amount of pneumocephalus and minimal increase in size of hematoma centered in the left thalamic region as detailed above. The amount of intraventricular blood, size of the broad-based right subdural hematoma and degree of enlargement of the lateral ventricles is relatively similar to most recent exam.  11/30/14 - Interval placement of ventriculostomy catheter via new RIGHT frontal burr hole with distal tip at the foramen of Monro, slightly decreased hydrocephalus. New 6 mm RIGHT holohemispheric acute subdural hematoma, a 4 mm RIGHT to LEFT midline shift (previously 4 mm LEFT  to RIGHT midline shift). Evolving LEFT thalamic intraparenchymal hematoma. Increasing hemorrhagic intraventricular extension.  11/30/14 - Left thalamic hemorrhage and intraventricular hemorrhage unchanged in volume. Progressive ventricular enlargement compatible with obstructive hydrocephalus at the level of the aqueduct.  11/26/14 - Unchanged left thalamic hemorrhage with intraventricular  extension.  11/26/2014   IMPRESSION: Acute 3.7 cm bleed in the left basal ganglia with decompression into the ventricles, moderate volume of intraventricular blood. There is 5 mm left-to-right midline shift.     Chest Port 1 View 11/30/14 - No acute chest findings. Mild basilar atelectasis.  11/26/2014    IMPRESSION: Prominence of the pulmonary interstitial markings and probable small left pleural effusion. Without previous studies it is not possible no with these findings are acute or old. A PA and lateral chest x-ray with deep inspiratory effort would be useful.    2D Echocardiogram  Left ventricle: The cavity size was normal. There was moderate concentric hypertrophy. Systolic function was normal. The estimated ejection fraction was in the range of 55% to 60%. Wall motion was normal; there were no regional wall motion abnormalities. Doppler parameters are consistent with abnormal left ventricular relaxation (grade 1 diastolic dysfunction). - Mitral valve: Mildly to moderately calcified annulus. - Left atrium: The atrium was moderately dilated. Impressions: - No cardiac source of emboli was indentified.  EKG  normal sinus rhythm. For complete results please see formal report.  EEG This EEG is consistent with a zone of potential epileptogenicity in the left parietal region. There was no seizure recorded on this study.   PHYSICAL EXAM  Temp:  [98.4 F (36.9 C)-99 F (37.2 C)] 98.9 F (37.2 C) (05/25 0733) Pulse Rate:  [38-108] 103 (05/25 1400) Resp:  [17-29] 23 (05/25 1400) BP: (133-179)/(55-104) 176/84 mmHg (05/25 1400) SpO2:  [93 %-100 %] 98 % (05/25 1400) FiO2 (%):  [40 %] 40 % (05/25 0740)  General - Well nourished, well developed, mild distress with respiration, occasional strider.  Ophthalmologic - not cooperative on exam.  Cardiovascular - Regular rate and rhythm with no murmur.  Neuro - awake alert today, eyes open spontaneously, but not following simple  commands. PERRL, eyes in the middle position, but attending to left, not blinking to visual threat on the right, right facial droop. Right UE 0/5 and RLE no spontaneous movement but 2+/5 on pain stimulation. LUE and LLE spontaneous movement and localizing to pain. Right babinski positive. Reflex 1+. Sensation, coordination and gait not tested.  ASSESSMENT/PLAN Kathryn Khan is a 69 y.o. female with history of HTN admitted for left BG hemorrhage with intraventricular extension. Symptoms stabilized and then transferred to floor. Found to have decreased mental status, repeat CT showed hydrocephalus. Transfer back to ICU for EVD placement.  Intraventricular hemorrhage s/p EVD  Currently drainage well from EVD  Dr. Jordan Likes is on board  Repeat CT showed no significant chage  Consider intraventricular TPA tomorrow.  Left BG hemorrhage with intraventricular extension - likely due to hypertensive bleed  CT showed left BG hemorrhage with ventricular extension  Repeat CT showed stable hematoma  Repeat CT head 5/22 showed obstructive hydrocephalus  2D Echo  unremarkable  LDL 139, not at the goal  [id number] 5.7  SCDs  for VTE prophylaxis  no antithrombotic prior to admission, now on no antithrombotic  BP control with goal < 160  Ongoing aggressive stroke risk factor management  Therapy recommendations:  pending  Disposition:  Pending  Obstructive hydrocephalus  Showing on the repeat CT   Likely to explain  her lethargy and clinical decline 11/30/14  EVD placed  Currently drainage well  Repeat CT showed better hydrocephalus  ? Seizure   Family report some eye deviation to right and left hand tremor  EEG left parietal sharps. 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 Add lisinopril and hydralazine today after PANDA tube BP goal < 160  Hyperlipidemia  Home meds:  none   Currently on none  LDL 139, goal < 70  on lipitor 20  Continue statin at discharge  UTI  UA showed WBC too numerous to count  Put on cipro  urine cultures E Coli and sensitive to cipro  Other Stroke Risk Factors  Advanced age  ETOH use - 2-3 drinks daily  Other Active Problems  Leukocytosis - resolved  Other Pertinent History  Not check BP at home  Hospital day # 7  This patient is critically ill due to recurrent intraventricular hemorrhage, left basal ganglia hemorrhage with ventricular extension, strider post extubation and at significant risk of neurological worsening, death form recurrent hemorrhage, obstructive hydrocephalus, cerebral edema, brain herniation, seizure, and cardiopulmonary failure. This patient's care requires constant monitoring of vital signs, hemodynamics, respiratory and cardiac monitoring, review of multiple databases, neurological assessment, discussion with family, other specialists and medical decision making of high complexity. I spent 40 minutes of neurocritical care time in the care of this patient.   Kathryn PlanJindong Roniel Halloran, MD PhD Stroke Neurology 12/03/2014 3:11 PM   To contact Stroke Continuity provider, please refer to [email or web address]. After hours, contact General Neurology

## 2014-12-03 NOTE — Progress Notes (Signed)
Patient continues to tolerate extubation. Will open eyes to voice. Remains nonverbal. Motor purposeful on the left and both lower extremities. Weak flexion on right.  Ventriculostomy working well. Drainage about 10 mL/h. Drainage bloody.  Follow-up head CT scan demonstrates some decreased and the lateral ventricular size area and this still with dense blood clot within lateral ventricles third ventricle and fourth ventricle.  Patient with obstructive hydrocephalus secondary to intraventricular hemorrhage. I discussed options with the family. If we continue with external ventricular drainage alone the patient has a high risk of needing external ventricular drainage for the next 2-3 weeks. This carries with it significant medical risk including the risk of ventriculitis as well as other hospital/ICU complications. Our experience using TPA intraventricularly has been good. Certainly this does carry the risk of further intraventricular hemorrhage. It is my belief that the benefits with intraventricular TPA outweighed the risks. I recommended that we start intraventricular TPA treatment tomorrow. The family agrees to proceed.

## 2014-12-03 NOTE — Consult Note (Signed)
PULMONARY / CRITICAL CARE MEDICINE   Name: Kathryn Khan MRN: [id number] DOB: 26-Apr-1946    ADMISSION DATE:  11/26/2014 CONSULTATION DATE:  5/23  REFERRING MD :  Tora Kindred   INITIAL PRESENTATION:  12 F adm to stroke service 5/18 with acute L basal ganglia hemorrhage and IVH. Had progressive hydrocephalus and underwent R frontal ventriculostomy 5/22. Catheter clotted requiring replacement. Progressive obtundation required ETI 5/22 PM. 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 R hemiplegia and aphasia on exam 5/18 CT head: Acute 3.7 cm bleed in the left basal ganglia with decompression into the ventricles, moderate volume of intraventricular blood. There is 5 mm left-to-right midline shift 5/18 repeat CT head: Unchanged left thalamic hemorrhage with intraventricular extension. 5/19 More awake alert and can repeat with answering some questions appropriately but severe dysarthria 5/20 Overall stable 5/21 Increased BP. Metoprolol initiated 5/22 AM: Rapid response call for decreased LOC. Low grade fever. Pyuria. Ciprofloxacin initiated 5/22 Stat CT head: Left thalamic hemorrhage and intraventricular hemorrhage unchanged in volume. 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 R frontal ventriculostomy placed  5/22 Intubated for depressed LOC 5/22 ventric drain malfunction. IVC replaced [name]) 5/22 repeat CT head: Stable size of the left thalamic hematoma. Stable vasogenic edema and stable mild midline shift. Stable obstructive hydrocephalus with blood in the cerebral aqueduct 5/23 CT head: minimal decrease in amount of pneumocephalus and minimal increase in size of hematoma centered in the left thalamic region as detailed above. The amount of intraventricular blood, size of the broad-based right subdural hematoma  and degree of enlargement of the lateral ventricles is relatively similar to most recent exam 5/24 LOC improved. Passed SBT. Developed stridor approx one hour after extubation. Received nebulized racemic epi X 2 and methylpred initiated. PRN NPPV 5/25 Evolving LEFT thalamic hematoma with intraventricular extension, similar intraventricular blood products, mild improved residual hydrocephalus with RIGHT frontal ventriculostomy catheter in place  INDWELLING DEVICES:: ETT 5/22 >> 5/24 R IJ CVL 5/22 >>   MICRO DATA: MRSA PCR 5/18 >> NEG Urine 5/19 >> E coli C diff PCR 5/20 >> NEG  ANTIMICROBIALS:  Cipro 5/19 >> 5/24  SUBJECTIVE:  RASS 0. Not F/C. Appears aphasic. Stridor much improved  VITAL SIGNS: Temp:  [98.4 F (36.9 C)-99 F (37.2 C)] 98.9 F (37.2 C) (05/25 0733) Pulse Rate:  [38-108] 91 (05/25 1110) Resp:  [16-29] 22 (05/25 1110) BP: (133-179)/(55-104) 155/83 mmHg (05/25 1100) SpO2:  [93 %-100 %] 100 % (05/25 1110) FiO2 (%):  [40 %] 40 % (05/25 0740) HEMODYNAMICS:   VENTILATOR SETTINGS: Vent Mode:  [-]  FiO2 (%):  [40 %] 40 % INTAKE / OUTPUT:  Intake/Output Summary (Last 24 hours) at 12/03/14 1157 Last data filed at 12/03/14 1100  Gross per 24 hour  Intake 1248.37 ml  Output   3023 ml  Net -1774.63 ml    PHYSICAL EXAMINATION: General: WDWN, NAD, minimal stridor by auscultation Neuro:  EOMI, PERRL, R hemiplegia but moving RLE some HEENT: NCAT Cardiovascular: Reg, no M Lungs: Clear Abdomen: Soft, NT, +BS Ext: warm, no edema  LABS:  CBC  Recent Labs Lab 12/01/14 0600 12/02/14 0415 12/03/14 0520  WBC 11.6* 9.3 8.8  HGB 11.7* 10.5* 11.9*  HCT 35.1* 31.9* 35.1*  PLT 202 176 227   Coag's No results for input(s): APTT, INR in the last 168 hours. BMET  Recent  Labs Lab 12/01/14 0600 12/02/14 0415 12/03/14 0520  NA 134* 139 136  K 3.4* 3.9 3.2*  CL 101 109 98*  CO2 27 23 27   BUN 21* 23* 14  CREATININE 0.63 0.47 0.50  GLUCOSE 168* 188* 175*    Electrolytes  Recent Labs Lab 12/01/14 0600 12/02/14 0415 12/03/14 0520  CALCIUM 8.8* 8.8* 9.3   Sepsis Markers No results for input(s): LATICACIDVEN, PROCALCITON, O2SATVEN in the last 168 hours. ABG  Recent Labs Lab 11/30/14 2235  PHART 7.458*  PCO2ART 34.3*  PO2ART 229*   Liver Enzymes  Recent Labs Lab 11/30/14 1520  AST 22  ALT 23  ALKPHOS 56  BILITOT 1.2  ALBUMIN 3.8   Cardiac Enzymes No results for input(s): TROPONINI, PROBNP in the last 168 hours. Glucose  Recent Labs Lab 11/30/14 0828 12/01/14 1618 12/02/14 0003 12/02/14 0725 12/02/14 1116 12/02/14 1506  GLUCAP 137* 148* 173* 200* 147* 172*    CXR: NNF    ASSESSMENT / PLAN:  PULMONARY A: VDRF due to AMS Post extubation stridor - improved P:   Cont supp O2 Cont methylpred 5/24 -  Cont PRN NPPV  CARDIOVASCULAR A:  Hyperlipidemia - chronic statin  Htn - chronic beta blocker, ACEI Sinus bradycardia, resolved Intermittent Htn P:  SBP goal < 160 mmHg Cont clevidipine per stroke team  RENAL A:   Mild hypokalemia, recurrent P:   Monitor BMET intermittently Monitor I/Os Correct electrolytes as indicated  GASTROINTESTINAL A:  Dysphagia P:   SUP: enteral famotidine Place NGT for meds and TFs  HEMATOLOGIC A:   Mild ICU acquired anemia without acute blood loss P:  DVT px: SCDs Monitor CBC intermittently Transfuse per usual ICU guidelines  INFECTIOUS A:   Uncomplicated UTI, treated P:   Micro and abx as above   ENDOCRINE A:   Hyperglycemia without prior dx of DM  Exacerbated by steroids P:   Cont sens scale SSI Goal glu < 180  NEUROLOGIC A:  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  After 5:30 PM or weekends, call  [phone] Pulmonary and Critical Care Medicine [facility]  12/03/2014, 11:57 AM

## 2014-12-03 NOTE — Progress Notes (Signed)
eLink Physician-Brief Progress Note Patient Name: Kathryn NovakChantal A Khan DOB: 02/14/1946 MRN: [id number]   Date of Service  12/03/2014  HPI/Events of Note  Starting to develop some upper airway stridor. No real respiratory distress or O2 saturation issues. Patient is already on Solumedrol.   eICU Interventions  Will order Racemic Epinephrine via neb X 1.      Intervention Category Intermediate Interventions: Other:  Deniz Eskridge Dennard Nipugene 12/03/2014, 1:31 AM

## 2014-12-03 NOTE — Progress Notes (Signed)
0125 called RT to assess pt. Called Elink with concerns for respiratory stridor. Orders for racemic per Dr. Arsenio LoaderSommer. RT at bedside. Will continue to monitor.

## 2014-12-03 NOTE — Evaluation (Signed)
Clinical/Bedside Swallow Evaluation Patient Details  Name: Kathryn Khan MRN: [id number] Date of Birth: 06/30/1946  Today's Date: 12/03/2014 Time: SLP Start Time (ACUTE ONLY): 1400 SLP Stop Time (ACUTE ONLY): 1446 SLP Time Calculation (min) (ACUTE ONLY): 46 min  Past Medical History:  Past Medical History  Diagnosis Date  . Hypertension    Past Surgical History:  Past Surgical History  Procedure Laterality Date  . Knee surgery    . Shoulder surgery     HPI:  69 y.o. female with history of HTN admitted for left BG hemorrhage with intraventricular extension.  Initial BSE on 11/27/14 with recommendation for Dys 2 with Nectar thick liquids.  Pt became more lethargic on 5/21 and required intubation on  pt recquired intubation on 5/22 and was extubated on 5/24.  Panda tube was just placed today, as was order for repeat swallow evaluation.   Assessment / Plan / Recommendation Clinical Impression  Pt currently is not able to safely take po's, with primary issue appearing to be cognition/poor awareness of bolus.  Due to previous decline with intubation, an objective study is recommended when pt is able to particiipate, and prior to resuming a diet.  SLP will follow for readiness for further study of swallow function.  Continue NPO for now.      Aspiration Risk  Severe    Diet Recommendation NPO   Medication Administration: Via alternative means    Other  Recommendations Oral Care Recommendations: Oral care QID;Staff/trained caregiver to provide oral care   Follow Up Recommendations       Frequency and Duration min 2x/week  2 weeks   Pertinent Vitals/Pain n/a         Swallow Study Prior Functional Status       General Other Pertinent Information: 69 y.o. female with history of HTN admitted for left BG hemorrhage with intraventricular extension.  Initial BSE on 11/27/14 with recommendation for Dys 2 with Nectar thick liquids.  Pt became more lethargic on 5/21 and required  intubation on  pt recquired intubation on 5/22 and was extubated on 5/24.  Panda tube was just placed today, as was order for repeat swallow evaluation. Type of Study: Bedside swallow evaluation Previous Swallow Assessment: 11/27/14 BSE, Dys 2 nectar recommended Diet Prior to this Study: NPO Temperature Spikes Noted: No Respiratory Status: Supplemental O2 delivered via (comment) History of Recent Intubation: Yes Length of Intubations (days): 2 days Date extubated: 12/02/14 Behavior/Cognition: Alert;Confused;Requires cueing;Doesn't follow directions Oral Cavity - Dentition: Adequate natural dentition/normal for age Self-Feeding Abilities: Total assist Patient Positioning: Upright in bed Baseline Vocal Quality: Aphonic Volitional Cough: Cognitively unable to elicit Volitional Swallow: Unable to elicit    Oral/Motor/Sensory Function Overall Oral Motor/Sensory Function: Impaired Labial ROM: Reduced right Labial Symmetry: Abnormal symmetry right Labial Strength: Reduced Labial Sensation: Reduced Lingual ROM:  (unable to assess not f/c's))   Ice Chips Ice chips: Impaired Presentation: Spoon Oral Phase Impairments: Reduced labial seal;Reduced lingual movement/coordination;Poor awareness of bolus Oral Phase Functional Implications: Prolonged oral transit   Thin Liquid Thin Liquid: Impaired Presentation: Spoon Oral Phase Impairments: Reduced labial seal;Reduced lingual movement/coordination;Impaired anterior to posterior transit;Poor awareness of bolus Oral Phase Functional Implications: Left anterior spillage    Nectar Thick Nectar Thick Liquid: Not tested   Honey Thick Honey Thick Liquid: Not tested   Puree Presentation: Spoon Oral Phase Impairments: Reduced labial seal;Reduced lingual movement/coordination;Poor awareness of bolus Oral Phase Functional Implications: Prolonged oral transit;Oral residue Pharyngeal Phase Impairments: Multiple swallows   Solid  GO    Solid: Not tested        Maryjo Rochester T 12/03/2014,2:47 PM

## 2014-12-03 NOTE — Progress Notes (Signed)
PT Cancellation Note  Patient Details Name: Kathryn Khan MRN: [id number] DOB: 09/23/1945   Cancelled Treatment:    Reason Eval/Treat Not Completed: Medical issues which prohibited therapy.  Still not responsive post ventricular Tpa. 12/03/2014  [place] BingKen Jalexis Breed, PT [phone]  (pager)   Jachelle Fluty, Eliseo GumKenneth V 12/03/2014, 11:02 AM

## 2014-12-03 NOTE — Progress Notes (Signed)
RT called to assess patient for possible stridor. Pt is in no distress at this time. Stridor noted and upper airway secretions. Pt is not able to cough/clear secretions. RT suctioned the back of pt throat to clear secretions. RN contacted MD and Racemic Epi was ordered. Racemic Epi administered and there was no change. RT and RN will continue to watch patient. MD stated to call if pt developed respiratory distress. RT will monitor.

## 2014-12-04 ENCOUNTER — Inpatient Hospital Stay (HOSPITAL_COMMUNITY): Payer: Medicare HMO

## 2014-12-04 DIAGNOSIS — E785 Hyperlipidemia, unspecified: Secondary | ICD-10-CM | POA: Insufficient documentation

## 2014-12-04 DIAGNOSIS — Z7189 Other specified counseling: Secondary | ICD-10-CM

## 2014-12-04 DIAGNOSIS — G911 Obstructive hydrocephalus: Secondary | ICD-10-CM

## 2014-12-04 LAB — GLUCOSE, CAPILLARY
GLUCOSE-CAPILLARY: 203 mg/dL — AB (ref 65–99)
GLUCOSE-CAPILLARY: 222 mg/dL — AB (ref 65–99)
GLUCOSE-CAPILLARY: 198 mg/dL — AB (ref 65–99)
Glucose-Capillary: 172 mg/dL — ABNORMAL HIGH (ref 65–99)
Glucose-Capillary: 200 mg/dL — ABNORMAL HIGH (ref 65–99)
Glucose-Capillary: 288 mg/dL — ABNORMAL HIGH (ref 65–99)

## 2014-12-04 LAB — BASIC METABOLIC PANEL
ANION GAP: 9 (ref 5–15)
BUN: 28 mg/dL — AB (ref 6–20)
CO2: 27 mmol/L (ref 22–32)
CREATININE: 0.51 mg/dL (ref 0.44–1.00)
Calcium: 9.4 mg/dL (ref 8.9–10.3)
Chloride: 99 mmol/L — ABNORMAL LOW (ref 101–111)
GFR calc non Af Amer: >60 mL/min (ref >60)
Glucose, Bld: 258 mg/dL — ABNORMAL HIGH (ref 65–99)
Potassium: 3.7 mmol/L (ref 3.5–5.1)
SODIUM: 135 mmol/L (ref 135–145)

## 2014-12-04 MED ORDER — INSULIN ASPART 100 UNIT/ML ~~LOC~~ SOLN: 0.0000 [IU] | Freq: Three times a day (TID) | SUBCUTANEOUS | Status: DC

## 2014-12-04 MED ORDER — JEVITY 1.2 CAL PO LIQD
1000.0000 mL | ORAL | Status: DC
  Administered 2014-12-04 – 2014-12-09 (×4): 1000 mL
  Filled 2014-12-04 (×9): qty 1000

## 2014-12-04 MED ORDER — INSULIN ASPART 100 UNIT/ML ~~LOC~~ SOLN
0.0000 [IU] | SUBCUTANEOUS | Status: DC
  Administered 2014-12-05 (×3): 3 [IU] via SUBCUTANEOUS
  Administered 2014-12-05 (×3): 5 [IU] via SUBCUTANEOUS
  Administered 2014-12-06 – 2014-12-08 (×17): 3 [IU] via SUBCUTANEOUS
  Administered 2014-12-08: 2 [IU] via SUBCUTANEOUS
  Administered 2014-12-09: 8 [IU] via SUBCUTANEOUS
  Administered 2014-12-09 (×2): 5 [IU] via SUBCUTANEOUS

## 2014-12-04 MED ORDER — LISINOPRIL 20 MG PO TABS
20.0000 mg | ORAL_TABLET | Freq: Two times a day (BID) | ORAL | Status: DC
  Filled 2014-12-04 (×2): qty 1

## 2014-12-04 MED ORDER — ALTEPLASE 100 MG IV SOLR
2.0000 mg | Freq: Once | INTRAVENOUS | Status: DC
  Filled 2014-12-04 (×2): qty 2

## 2014-12-04 MED ORDER — HYDRALAZINE HCL 50 MG PO TABS
50.0000 mg | ORAL_TABLET | Freq: Three times a day (TID) | ORAL | Status: DC
  Administered 2014-12-04 – 2014-12-10 (×18): 50 mg
  Filled 2014-12-04 (×21): qty 1

## 2014-12-04 MED ORDER — IRBESARTAN 150 MG PO TABS
150.0000 mg | ORAL_TABLET | Freq: Every day | ORAL | Status: DC
  Administered 2014-12-04 – 2014-12-09 (×6): 150 mg
  Filled 2014-12-04 (×7): qty 1

## 2014-12-04 NOTE — Progress Notes (Signed)
Speech Language Pathology Treatment: Dysphagia  Patient Details Name: Conard NovakChantal A Smiles MRN: [id number] DOB: 10/28/1945 Today's Date: 12/04/2014 Time: [id number] SLP Time Calculation (min) (ACUTE ONLY): 10 min  Assessment / Plan / Recommendation Clinical Impression  Diagnostic treatment focused on facilitation of swallow response for progression to pos. Patient lethargic requiring max tactile cueing for eye opening, increased alertness. Even in alert state, eyes largely fixed, patient unable to follow commands. Tactile stimulation via oral care provided with little oral response. Respirations audible/wet indicative of secretions in upper airway. Attempts to elicit swallow response to clear secretions via max verbal, tactile, and visual cues unsuccessful. Aspiration risk remains high with all pos at this time. SLP will continue to f/u for diagnostic dysphagia treatment and cog-linguistic treatment.    HPI Other Pertinent Information: 69 y.o. female with history of HTN admitted for left BG hemorrhage with intraventricular extension.  Initial BSE on 11/27/14 with recommendation for Dys 2 with Nectar thick liquids.  Pt became more lethargic on 5/21 and required intubation on 5/22 and was extubated on 5/24.     Pertinent Vitals Pain Assessment: Faces Faces Pain Scale: No hurt  SLP Plan  Continue with current plan of care    Recommendations Diet recommendations: NPO Medication Administration: Via alternative means              Oral Care Recommendations: Oral care QID Plan: Continue with current plan of care    GO   [facility] Penni Penado MA, CCC-SLP [phone]   Boyce Keltner Meryl 12/04/2014, 8:59 AM

## 2014-12-04 NOTE — Progress Notes (Signed)
STROKE TEAM PROGRESS NOTE   SUBJECTIVE (INTERVAL HISTORY) Her daughters and husband are at the bedside. Her EVD drainage continues to drain. Still bloody. Breathing much better, no stridor. Dr. Jordan Likes is going to do intraventricular TPA.   OBJECTIVE Temp:  [98.3 F (36.8 C)-99 F (37.2 C)] 99 F (37.2 C) (05/26 1604) Pulse Rate:  [83-100] 90 (05/26 0930) Cardiac Rhythm:  [-] Normal sinus rhythm (05/26 0800) Resp:  [20-43] 27 (05/26 0930) BP: (140-170)/(70-87) 153/71 mmHg (05/26 1500) SpO2:  [94 %-97 %] 96 % (05/26 0930)   Recent Labs Lab 12/03/14 2017 12/04/14 0009 12/04/14 0408 12/04/14 0819 12/04/14 1215  GLUCAP 172* 200* 288* 222* 198*    Recent Labs Lab 11/30/14 0344 12/01/14 0600 12/02/14 0415 12/03/14 0520 12/04/14 0535  NA 136 134* 139 136 135  K 3.5 3.4* 3.9 3.2* 3.7  CL 102 101 109 98* 99*  CO2 GLUCOSE 148* 168* 188* 175* 258*  BUN 10 21* 23* 14 28*  CREATININE 0.54 0.63 0.47 0.50 0.51  CALCIUM 9.1 8.8* 8.8* 9.3 9.4    Recent Labs Lab 11/30/14 1520  AST 22  ALT 23  ALKPHOS 56  BILITOT 1.2  PROT 7.0  ALBUMIN 3.8    Recent Labs Lab 11/29/14 0448 11/30/14 0344 12/01/14 0600 12/02/14 0415 12/03/14 0520  WBC 7.8 6.9 11.6* 9.3 8.8  HGB 12.2 12.4 11.7* 10.5* 11.9*  HCT 37.4 37.6 35.1* 31.9* 35.1*  MCV 98.7 98.9 98.0 100.6* 97.8  PLT 189 213 202 176 227   No results for input(s): CKTOTAL, CKMB, CKMBINDEX, TROPONINI in the last 168 hours. No results for input(s): LABPROT, INR in the last 72 hours.  Recent Labs  12/03/14 1608  COLORURINE YELLOW  LABSPEC 1.014  PHURINE 6.0  GLUCOSEU 100*  HGBUR MODERATE*  BILIRUBINUR NEGATIVE  KETONESUR 40*  PROTEINUR NEGATIVE  UROBILINOGEN 1.0  NITRITE NEGATIVE  LEUKOCYTESUR NEGATIVE       Component Value Date/Time   CHOL 226* 11/26/2014 1200   TRIG 47 11/26/2014 1200   HDL 78 11/26/2014 1200   CHOLHDL 2.9 11/26/2014 1200   VLDL 9 11/26/2014 1200   LDLCALC 139* 11/26/2014 1200    Lab Results  Component Value Date   HGBA1C 5.7* 11/26/2014   No results found for: LABOPIA, COCAINSCRNUR, LABBENZ, AMPHETMU, THCU, LABBARB  No results for input(s): ETH in the last 168 hours.  I have personally reviewed the radiological images below and agree with the radiology interpretations.  Ct Head (brain) Wo Contrast 12/03/14 - Evolving LEFT thalamic hematoma with intraventricular extension, similar intraventricular blood products, mild improved residual hydrocephalus with RIGHT frontal ventriculostomy catheter in place. Similar blood products along the catheter tract. Stable small RIGHT holohemispheric subdural hematoma measuring up to 9 mm. Similar 4 mm RIGHT to LEFT midline shift.  12/01/14 - When compared to the most recent examination, minimal decrease in amount of pneumocephalus and minimal increase in size of hematoma centered in the left thalamic region as detailed above. The amount of intraventricular blood, size of the broad-based right subdural hematoma and degree of enlargement of the lateral ventricles is relatively similar to most recent exam.  11/30/14 - Interval placement of ventriculostomy catheter via new RIGHT frontal burr hole with distal tip at the foramen of Monro, slightly decreased hydrocephalus. New 6 mm RIGHT holohemispheric acute subdural hematoma, a 4 mm RIGHT to LEFT midline shift (previously 4 mm LEFT to RIGHT midline shift). Evolving LEFT thalamic intraparenchymal hematoma. Increasing hemorrhagic intraventricular extension.  11/30/14 - Left thalamic hemorrhage and intraventricular hemorrhage unchanged in volume. Progressive ventricular enlargement compatible with obstructive hydrocephalus at the level of the aqueduct.  11/26/14 - Unchanged left thalamic hemorrhage with intraventricular extension.  11/26/2014   IMPRESSION: Acute 3.7 cm bleed in the left basal ganglia with decompression into the ventricles, moderate volume of intraventricular  blood. There is 5 mm left-to-right midline shift.     Chest Port 1 View 11/30/14 - No acute chest findings. Mild basilar atelectasis.  11/26/2014    IMPRESSION: Prominence of the pulmonary interstitial markings and probable small left pleural effusion. Without previous studies it is not possible no with these findings are acute or old. A PA and lateral chest x-ray with deep inspiratory effort would be useful.    2D Echocardiogram  Left ventricle: The cavity size was normal. There was moderate concentric hypertrophy. Systolic function was normal. The estimated ejection fraction was in the range of 55% to 60%. Wall motion was normal; there were no regional wall motion abnormalities. Doppler parameters are consistent with abnormal left ventricular relaxation (grade 1 diastolic dysfunction). - Mitral valve: Mildly to moderately calcified annulus. - Left atrium: The atrium was moderately dilated. Impressions: - No cardiac source of emboli was indentified.  EKG  normal sinus rhythm. For complete results please see formal report.  EEG This EEG is consistent with a zone of potential epileptogenicity in the left parietal region. There was no seizure recorded on this study.   PHYSICAL EXAM  Temp:  [98.3 F (36.8 C)-99 F (37.2 C)] 99 F (37.2 C) (05/26 1604) Pulse Rate:  [83-100] 90 (05/26 0930) Resp:  [20-43] 27 (05/26 0930) BP: (140-170)/(70-87) 153/71 mmHg (05/26 1500) SpO2:  [94 %-97 %] 96 % (05/26 0930)  General - Well nourished, well developed, mild distress with respiration, occasional strider.  Ophthalmologic - not cooperative on exam.  Cardiovascular - Regular rate and rhythm with no murmur.  Neuro - awake alert today, eyes open spontaneously, but not following simple commands. PERRL, eyes in the middle position, but attending to left, not blinking to visual threat on the right, right facial droop. Right UE 0/5 and RLE no spontaneous movement but 2+/5 on pain  stimulation. LUE and LLE spontaneous movement and localizing to pain. Right babinski positive. Reflex 1+. Sensation, coordination and gait not tested.  ASSESSMENT/PLAN Ms. Conard NovakChantal A Hagemeister is a 69 y.o. female with history of HTN admitted for left BG hemorrhage with intraventricular extension. Symptoms stabilized and then transferred to floor. Found to have decreased mental status, repeat CT showed hydrocephalus. Transfer back to ICU for EVD placement.  Intraventricular hemorrhage s/p EVD  Currently drainage well from EVD  Dr. Jordan LikesPool is on board  Repeat CT showed no significant chage  intraventricular TPA today.  Left BG hemorrhage with intraventricular extension - likely due to hypertensive bleed  CT showed left BG hemorrhage with ventricular extension  Repeat CT showed stable hematoma  Repeat CT head 5/22 showed obstructive hydrocephalus  2D Echo  unremarkable  LDL 139, not at the goal  [id number] 5.7  SCDs  for VTE prophylaxis  no antithrombotic prior to admission, now on no antithrombotic  BP control with goal < 160  Ongoing aggressive stroke risk factor management  Therapy recommendations:  pending  Disposition:  Pending  Obstructive hydrocephalus  Showing on the repeat CT   Likely to explain her lethargy and clinical decline 11/30/14  EVD placed  Currently drainage well  Repeat CT showed better hydrocephalus  ? Seizure  Family report some eye deviation to right and left hand tremor  EEG left parietal sharps. Already on keppra  on keppra for seizure prevention  Hyponatremia  Na 131->132->133->135->136->134->139->136->135  Urine Na 180, consistent with salt wasting syndrome  On oral salt tablet  continue oral salt 2g tid  Close monitoring  Hyperglycemia  SSI  Likely due to tube feeding  CBG monitoring  Hypertension  Home meds:   Lisinopril, metoprolol BP still high Put on cleviprex drip, wean off as able. Increase lisinopril and  hydralazine today  BP goal < 160  Hyperlipidemia  Home meds:  none   Currently on none  LDL 139, goal < 70  on lipitor 20  Continue statin at discharge  UTI - resolved  UA showed WBC too numerous to count  Put on cipro  urine cultures E Coli and sensitive to cipro  Repeat UA clear after antibiotics course  Other Stroke Risk Factors  Advanced age  ETOH use - 2-3 drinks daily  Other Active Problems  Leukocytosis - resolved  Other Pertinent History  Not check BP at home  Hospital day # 8  This patient is critically ill due to recurrent intraventricular hemorrhage, left basal ganglia hemorrhage with ventricular extension, strider post extubation and at significant risk of neurological worsening, death form recurrent hemorrhage, obstructive hydrocephalus, cerebral edema, brain herniation, seizure, and cardiopulmonary failure. This patient's care requires constant monitoring of vital signs, hemodynamics, respiratory and cardiac monitoring, review of multiple databases, neurological assessment, discussion with family, other specialists and medical decision making of high complexity. I spent 40 minutes of neurocritical care time in the care of this patient.   [name], MD PhD Stroke Neurology 12/04/2014 5:12 PM   To contact Stroke Continuity provider, please refer to [email or web address]. After hours, contact General Neurology

## 2014-12-04 NOTE — Procedures (Signed)
Pt on 2L [place] and SpO2 is 98%.  BIPAP is on standby in pt's room. RT will continue to monitor pt.

## 2014-12-04 NOTE — Progress Notes (Signed)
PULMONARY / CRITICAL CARE MEDICINE   Name: Kathryn Khan MRN: [id number] DOB: 01/03/1946    ADMISSION DATE:  11/26/2014 CONSULTATION DATE:  5/23  REFERRING MD :  Tora KindredXu/Stroke   INITIAL PRESENTATION:  [age] F adm to stroke service 5/18 with acute L basal ganglia hemorrhage and IVH. Had progressive hydrocephalus and underwent R frontal ventriculostomy 5/22. Catheter clotted requiring replacement. Progressive obtundation required ETI 5/22 PM. 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 R hemiplegia and aphasia on exam 5/18 CT head: Acute 3.7 cm bleed in the left basal ganglia with decompression into the ventricles, moderate volume of intraventricular blood. There is 5 mm left-to-right midline shift 5/18 repeat CT head: Unchanged left thalamic hemorrhage with intraventricular extension. 5/19 More awake alert and can repeat with answering some questions appropriately but severe dysarthria 5/20 Overall stable 5/21 Increased BP. Metoprolol initiated 5/22 AM: Rapid response call for decreased LOC. Low grade fever. Pyuria. Ciprofloxacin initiated 5/22 Stat CT head: Left thalamic hemorrhage and intraventricular hemorrhage unchanged in volume. 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 R frontal ventriculostomy placed  5/22 Intubated for depressed LOC 5/22 ventric drain malfunction. IVC replaced [name](Cram) 5/22 repeat CT head: Stable size of the left thalamic hematoma. Stable vasogenic edema and stable mild midline shift. Stable obstructive hydrocephalus with blood in the cerebral aqueduct 5/23 CT head: minimal decrease in amount of pneumocephalus and minimal increase in size of hematoma centered in the left thalamic region as detailed above. The amount of intraventricular blood, size of the broad-based right subdural hematoma  and degree of enlargement of the lateral ventricles is relatively similar to most recent exam 5/24 LOC improved. Passed SBT. Developed stridor approx one hour after extubation. Received nebulized racemic epi X 2 and methylpred initiated. PRN NPPV 5/25 Evolving LEFT thalamic hematoma with intraventricular extension, similar intraventricular blood products, mild improved residual hydrocephalus with RIGHT frontal ventriculostomy catheter in place 5/26 Stridor resolved. intraventricular tPA administered  INDWELLING DEVICES:: ETT 5/22 >> 5/24 R IJ CVL 5/22 >>   MICRO DATA: MRSA PCR 5/18 >> NEG Urine 5/19 >> E coli C diff PCR 5/20 >> NEG  ANTIMICROBIALS:  Cipro 5/19 >> 5/24  SUBJECTIVE:  RASS -2. Not F/C. Aphasic. Stridor resolved  VITAL SIGNS: Temp:  [98.3 F (36.8 C)-99 F (37.2 C)] 98.8 F (37.1 C) (05/26 1216) Pulse Rate:  [83-100] 90 (05/26 0930) Resp:  [20-43] 27 (05/26 0930) BP: (140-170)/(70-87) 153/71 mmHg (05/26 1500) SpO2:  [94 %-97 %] 96 % (05/26 0930) HEMODYNAMICS:   VENTILATOR SETTINGS:   INTAKE / OUTPUT:  Intake/Output Summary (Last 24 hours) at 12/04/14 1546 Last data filed at 12/04/14 0900  Gross per 24 hour  Intake 1734.18 ml  Output   1060 ml  Net 674.18 ml    PHYSICAL EXAMINATION: General: WDWN, NAD, no stridor by auscultation Neuro:  EOMI, PERRL, R hemiplegia  HEENT: NCAT Cardiovascular: Reg, no M Lungs: Clear Abdomen: Soft, NT, +BS Ext: warm, no edema  LABS:  CBC  Recent Labs Lab 12/01/14 0600 12/02/14 0415 12/03/14 0520  WBC 11.6* 9.3 8.8  HGB 11.7* 10.5* 11.9*  HCT 35.1* 31.9* 35.1*  PLT 202 176 227   Coag's No results for input(s): APTT, INR in the last 168 hours. BMET  Recent Labs Lab 12/02/14 0415 12/03/14 0520 12/04/14 0535  NA 139 136 135  K 3.9 3.2* 3.7  CL 109 98* 99*  CO2 BUN 23* 14 28*  CREATININE 0.47 0.50 0.51  GLUCOSE 188* 175* 258*   Electrolytes  Recent Labs Lab 12/02/14 0415  12/03/14 0520 12/04/14 0535  CALCIUM 8.8* 9.3 9.4   Sepsis Markers No results for input(s): LATICACIDVEN, PROCALCITON, O2SATVEN in the last 168 hours. ABG  Recent Labs Lab 11/30/14 2235  PHART 7.458*  PCO2ART 34.3*  PO2ART 229*   Liver Enzymes  Recent Labs Lab 11/30/14 1520  AST 22  ALT 23  ALKPHOS 56  BILITOT 1.2  ALBUMIN 3.8   Cardiac Enzymes No results for input(s): TROPONINI, PROBNP in the last 168 hours. Glucose  Recent Labs Lab 12/03/14 1602 12/03/14 2017 12/04/14 0009 12/04/14 0408 12/04/14 0819 12/04/14 1215  GLUCAP 203* 172* 200* 288* 222* 198*    CXR: NNF    ASSESSMENT / PLAN:  PULMONARY A: VDRF due to AMS Post extubation stridor - resolved P:   Cont supp O2 Dc methylpred  CARDIOVASCULAR A:  Hyperlipidemia - chronic statin  Htn - chronic beta blocker, ACEI Sinus bradycardia, resolved Intermittent Htn P:  SBP goal < 160 mmHg Cont clevidipine per stroke team Enteral antihypertensive meds per Stroke team Given the relationship btw ACEI and upper airway issues, would prefer ARB over ACEI  I have changed from Lisinopril to irbesartan  RENAL A:   Mild hypokalemia, recurrent P:   Monitor BMET intermittently Monitor I/Os Correct electrolytes as indicated  GASTROINTESTINAL A:  Dysphagia P:   SUP: enteral famotidine Cont TFs  HEMATOLOGIC A:   Mild ICU acquired anemia without acute blood loss P:  DVT px: SCDs Monitor CBC intermittently Transfuse per usual ICU guidelines  INFECTIOUS A:   Uncomplicated UTI, treated P:   Micro and abx as above   ENDOCRINE A:   Hyperglycemia without prior dx of DM  Exacerbated by steroids P:   Cont sens scale SSI Goal glu < 180  NEUROLOGIC A:  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 We  again discussed goals of care. They have opted for DNR and no re-intubation    PCCM will sign off. Please call if we can be of further assistance    Billy Fischer, MD ; [facility] [phone].  After 5:30 PM or weekends, call [phone] Pulmonary and Critical Care Medicine [facility]  12/04/2014, 3:46 PM

## 2014-12-04 NOTE — Progress Notes (Signed)
Overall stable. Ventriculostomy functioning well. Fluid still blood-tinged.  Awake and somewhat aware. Very purposeful with left upper and bilateral lower extremities. Weak flexion with right upper extremity. Pupils 3 mm and equal bilaterally. Gaze conjugate.  Intraventricular TPA given today. Plan follow-up CT scan in morning. Probably will redosed TPA tomorrow.

## 2014-12-04 NOTE — Progress Notes (Signed)
Nutrition Follow-up   INTERVENTION:  D/C Vital AF   Initiate Jevity 1.2 @ 50 ml/hr via NG tube   30 ml Prostat daily.    Tube feeding regimen provides 1540 kcal (100% of needs), 81 grams of protein, and 972 ml of H2O.    NUTRITION DIAGNOSIS:  Inadequate oral intake related to inability to eat as evidenced by NPO status.  ongoing  GOAL:  Patient will meet greater than or equal to 90% of their needs  Not met due to change in status  MONITOR:  TF tolerance, Labs, Weight trends  REASON FOR ASSESSMENT:  Consult Enteral/tube feeding initiation and management  ASSESSMENT:  80 F adm to stroke service 5/18 with acute L basal ganglia hemorrhage and IVH. Had progressive hydrocephalus and underwent R frontal ventriculostomy 5/22. Catheter clotted requiring replacement. Pt with decreased LOC and intubated 5/22  Pt extubated 5/24, unable to pass swallow eval, NG placed 5/25 (tip in distal stomach).  Labs reviewed: CBG's: [phone] - spoke with RN, will follow up with DM coordinator    Height:  Ht Readings from Last 1 Encounters:  11/30/14 $RemoveB'5\' 4"'SeylvFjn$  (1.626 m)    Weight:  Wt Readings from Last 1 Encounters:  11/26/14 160 lb 4.4 oz (72.7 kg)    Ideal Body Weight:  54.4 kg  Wt Readings from Last 10 Encounters:  11/26/14 160 lb 4.4 oz (72.7 kg)    BMI:  Body mass index is 27.5 kg/(m^2).  Estimated Nutritional Needs:  Kcal:  1500-1700  Protein:  75-90  Fluid:  > 1.5 L/day  Skin:  Reviewed, no issues  Diet Order:   NPO  EDUCATION NEEDS:  No education needs identified at this time   Intake/Output Summary (Last 24 hours) at 12/04/14 1635 Last data filed at 12/04/14 0900  Gross per 24 hour  Intake 1694.18 ml  Output    869 ml  Net 825.18 ml    Last BM:  5/24  Maylon Peppers RD, [place], [place] Pager [phone] After Hours Pager

## 2014-12-04 NOTE — Care Management Note (Signed)
Case Management Note  Patient Details  Name: Conard NovakChantal A Korman MRN: [id number] Date of Birth: 11/26/1945  Subjective/Objective:      Pt extubated, receiving intraventricular TPA today.  Not medically stable to participate in therapy.                Action/Plan: Will continue to follow progress; CSW cont to follow for likely SNF at dc.    Expected Discharge Date:                  Expected Discharge Plan:  Skilled Nursing Facility  In-House Referral:  Clinical Social Work  Discharge planning Services  CM Consult  Post Acute Care Choice:    Choice offered to:     DME Arranged:    DME Agency:     HH Arranged:    HH Agency:     Status of Service:  In process, will continue to follow  Medicare Important Message Given:    Date Medicare IM Given:    Medicare IM give by:    Date Additional Medicare IM Given:    Additional Medicare Important Message give by:     If discussed at Long Length of Stay Meetings, dates discussed:  12/02/14, 12/04/14  Additional Comments:  Quintella BatonJulie W. Alante Tolan, RN, BSN  Trauma/Neuro ICU Case Manager [phone]

## 2014-12-04 NOTE — Progress Notes (Signed)
Patient family approached me after speaking with Dr. Sung AmabileSimonds and Dr. Jordan LikesPool and as a family have decided not to re-intubate if that would become necessary. In their opinion this is what the patient would desire and the husband, Linward HeadlandWalt,and the family all agree equally on this decision.

## 2014-12-04 NOTE — Progress Notes (Signed)
Kathryn Cancellation Note  Patient Details Name: Kathryn Khan MRN: [id number] DOB: 12/31/1945   Cancelled Treatment:    Reason Eval/Treat Not Completed: Patient not medically ready today.  Will continue to monitor and await restart orders. 12/04/2014  Kathryn Khan, Kathryn [phone]  (pager)   Kathryn Khan, Kathryn Khan 12/04/2014, 10:00 AM

## 2014-12-05 ENCOUNTER — Inpatient Hospital Stay (HOSPITAL_COMMUNITY): Payer: Medicare HMO

## 2014-12-05 ENCOUNTER — Encounter (HOSPITAL_COMMUNITY): Payer: Self-pay

## 2014-12-05 LAB — GLUCOSE, CAPILLARY
GLUCOSE-CAPILLARY: 199 mg/dL — AB (ref 65–99)
GLUCOSE-CAPILLARY: 55 mg/dL — AB (ref 65–99)
Glucose-Capillary: 208 mg/dL — ABNORMAL HIGH (ref 65–99)
Glucose-Capillary: 214 mg/dL — ABNORMAL HIGH (ref 65–99)
Glucose-Capillary: 216 mg/dL — ABNORMAL HIGH (ref 65–99)
Glucose-Capillary: 224 mg/dL — ABNORMAL HIGH (ref 65–99)
Glucose-Capillary: 282 mg/dL — ABNORMAL HIGH (ref 65–99)
Glucose-Capillary: 198 mg/dL — ABNORMAL HIGH (ref 65–99)

## 2014-12-05 LAB — CBC
HEMATOCRIT: 38.4 % (ref 36.0–46.0)
Hemoglobin: 12.6 g/dL (ref 12.0–15.0)
MCH: 32.5 pg (ref 26.0–34.0)
MCHC: 32.8 g/dL (ref 30.0–36.0)
MCV: 99 fL (ref 78.0–100.0)
PLATELETS: 285 10*3/uL (ref 150–400)
RBC: 3.88 MIL/uL (ref 3.87–5.11)
RDW: 12.5 % (ref 11.5–15.5)
WBC: 10.8 10*3/uL — ABNORMAL HIGH (ref 4.0–10.5)

## 2014-12-05 LAB — BASIC METABOLIC PANEL
Anion gap: 8 (ref 5–15)
BUN: 25 mg/dL — ABNORMAL HIGH (ref 6–20)
CO2: 28 mmol/L (ref 22–32)
CREATININE: 0.52 mg/dL (ref 0.44–1.00)
Calcium: 9.1 mg/dL (ref 8.9–10.3)
Chloride: 101 mmol/L (ref 101–111)
GLUCOSE: 215 mg/dL — AB (ref 65–99)
POTASSIUM: 3.6 mmol/L (ref 3.5–5.1)
Sodium: 137 mmol/L (ref 135–145)

## 2014-12-05 MED ORDER — ALBUTEROL SULFATE (2.5 MG/3ML) 0.083% IN NEBU
2.5000 mg | INHALATION_SOLUTION | RESPIRATORY_TRACT | Status: DC | PRN
  Administered 2014-12-05 – 2014-12-08 (×9): 2.5 mg via RESPIRATORY_TRACT
  Filled 2014-12-05 (×9): qty 3

## 2014-12-05 MED ORDER — CLONIDINE HCL 0.1 MG PO TABS
0.1000 mg | ORAL_TABLET | Freq: Three times a day (TID) | ORAL | Status: DC
  Administered 2014-12-05 – 2014-12-09 (×15): 0.1 mg via ORAL
  Filled 2014-12-05 (×18): qty 1

## 2014-12-05 MED ORDER — HEPARIN SODIUM (PORCINE) 5000 UNIT/ML IJ SOLN
5000.0000 [IU] | Freq: Three times a day (TID) | INTRAMUSCULAR | Status: DC
  Administered 2014-12-05 – 2014-12-09 (×14): 5000 [IU] via SUBCUTANEOUS
  Filled 2014-12-05 (×17): qty 1

## 2014-12-05 MED ORDER — ACETYLCYSTEINE 20 % IN SOLN
4.0000 mL | Freq: Three times a day (TID) | RESPIRATORY_TRACT | Status: DC
  Administered 2014-12-05 – 2014-12-07 (×8): 4 mL via RESPIRATORY_TRACT
  Filled 2014-12-05 (×10): qty 4

## 2014-12-05 MED ORDER — ACETYLCYSTEINE 10 % IN SOLN
4.0000 mL | Freq: Three times a day (TID) | RESPIRATORY_TRACT | Status: DC
  Filled 2014-12-05 (×5): qty 4

## 2014-12-05 MED ORDER — INSULIN GLARGINE 100 UNIT/ML ~~LOC~~ SOLN
10.0000 [IU] | Freq: Every day | SUBCUTANEOUS | Status: DC
  Administered 2014-12-05 – 2014-12-09 (×5): 10 [IU] via SUBCUTANEOUS
  Filled 2014-12-05 (×6): qty 0.1

## 2014-12-05 NOTE — Progress Notes (Signed)
AM CPT held.  While awake order- Pt sleeping.

## 2014-12-05 NOTE — Progress Notes (Signed)
PT Cancellation Note  Patient Details Name: Conard NovakChantal A Grasmick MRN: [id number] DOB: 03/07/1946   Cancelled Treatment:    Reason Eval/Treat Not Completed: Medical issues which prohibited therapy.  Vented, interactions are non purposeful. Will see when appropriate. 12/05/2014  Wilkes BingKen Delsy Etzkorn, PT [phone]  (pager)   Scotlynn Noyes, Eliseo GumKenneth V 12/05/2014, 10:09 AM

## 2014-12-05 NOTE — Progress Notes (Addendum)
STROKE TEAM PROGRESS NOTE   SUBJECTIVE (INTERVAL HISTORY) No family is at the bedside. Her EVD drainage continues to drain and 10-15cc/h. Had intraventricular TPA treatment yesterday. Repeat CT today showed much decreased intraventricular blood product. Patient neurologically stable, no significant change from yesterday. More respiratory distress, started on chest PT and Mucomyst nebulization.   OBJECTIVE Temp:  [98 F (36.7 C)-100.7 F (38.2 C)] 99.1 F (37.3 C) (05/27 1129) Pulse Rate:  [72-130] 77 (05/27 1200) Cardiac Rhythm:  [-] Normal sinus rhythm (05/27 1000) Resp:  [18-35] 18 (05/27 1200) BP: (112-206)/(62-120) 112/62 mmHg (05/27 1200) SpO2:  [92 %-99 %] 96 % (05/27 1200)   Recent Labs Lab 12/04/14 2348 12/05/14 0421 12/05/14 0424 12/05/14 0427 12/05/14 0725  GLUCAP 224* 55* 282* 199* 214*    Recent Labs Lab 12/01/14 0600 12/02/14 0415 12/03/14 0520 12/04/14 0535 12/05/14 0804  NA 134* 139 136 135 137  K 3.4* 3.9 3.2* 3.7 3.6  CL 101 109 98* 99* 101  CO2 GLUCOSE 168* 188* 175* 258* 215*  BUN 21* 23* 14 28* 25*  CREATININE 0.63 0.47 0.50 0.51 0.52  CALCIUM 8.8* 8.8* 9.3 9.4 9.1    Recent Labs Lab 11/30/14 1520  AST 22  ALT 23  ALKPHOS 56  BILITOT 1.2  PROT 7.0  ALBUMIN 3.8    Recent Labs Lab 11/30/14 0344 12/01/14 0600 12/02/14 0415 12/03/14 0520 12/05/14 0804  WBC 6.9 11.6* 9.3 8.8 10.8*  HGB 12.4 11.7* 10.5* 11.9* 12.6  HCT 37.6 35.1* 31.9* 35.1* 38.4  MCV 98.9 98.0 100.6* 97.8 99.0  PLT 213 202 176 227 285   No results for input(s): CKTOTAL, CKMB, CKMBINDEX, TROPONINI in the last 168 hours. No results for input(s): LABPROT, INR in the last 72 hours.  Recent Labs  12/03/14 1608  COLORURINE YELLOW  LABSPEC 1.014  PHURINE 6.0  GLUCOSEU 100*  HGBUR MODERATE*  BILIRUBINUR NEGATIVE  KETONESUR 40*  PROTEINUR NEGATIVE  UROBILINOGEN 1.0  NITRITE NEGATIVE  LEUKOCYTESUR NEGATIVE       Component Value Date/Time   CHOL 226* 11/26/2014 1200   TRIG 47 11/26/2014 1200   HDL 78 11/26/2014 1200   CHOLHDL 2.9 11/26/2014 1200   VLDL 9 11/26/2014 1200   LDLCALC 139* 11/26/2014 1200   Lab Results  Component Value Date   HGBA1C 5.7* 11/26/2014   No results found for: LABOPIA, COCAINSCRNUR, LABBENZ, AMPHETMU, THCU, LABBARB  No results for input(s): ETH in the last 168 hours.  I have personally reviewed the radiological images below and agree with the radiology interpretations.  Ct Head (brain) Wo Contrast 12/05/14 - 1. No significant interval change in size of evolving left thalamic hematoma with intraventricular extension. Overall, amount of intraventricular hemorrhage is decreased from previous with slightly decreased ventricular size. Right frontal approach ventriculostomy catheter in stable position. 2. Slightly increased prominence of scattered subarachnoid hemorrhage within the bilateral temporoparietal regions, favored to be related to redistribution. No definite new hemorrhage. 3. Similar size of right holohemispheric subdural hematoma measuring up to 9 mm. Associated right-to-left midline shift slightly improved at 2 mm (previously 4 mm).  12/03/14 - Evolving LEFT thalamic hematoma with intraventricular extension, similar intraventricular blood products, mild improved residual hydrocephalus with RIGHT frontal ventriculostomy catheter in place. Similar blood products along the catheter tract. Stable small RIGHT holohemispheric subdural hematoma measuring up to 9 mm. Similar 4 mm RIGHT to LEFT midline shift.  12/01/14 - When compared to the most recent examination, minimal decrease in amount  of pneumocephalus and minimal increase in size of hematoma centered in the left thalamic region as detailed above. The amount of intraventricular blood, size of the broad-based right subdural hematoma and degree of enlargement of the lateral ventricles is relatively similar to most recent exam.  11/30/14  - Interval placement of ventriculostomy catheter via new RIGHT frontal burr hole with distal tip at the foramen of Monro, slightly decreased hydrocephalus. New 6 mm RIGHT holohemispheric acute subdural hematoma, a 4 mm RIGHT to LEFT midline shift (previously 4 mm LEFT to RIGHT midline shift). Evolving LEFT thalamic intraparenchymal hematoma. Increasing hemorrhagic intraventricular extension.  11/30/14 - Left thalamic hemorrhage and intraventricular hemorrhage unchanged in volume. Progressive ventricular enlargement compatible with obstructive hydrocephalus at the level of the aqueduct.  11/26/14 - Unchanged left thalamic hemorrhage with intraventricular extension.  11/26/2014   IMPRESSION: Acute 3.7 cm bleed in the left basal ganglia with decompression into the ventricles, moderate volume of intraventricular blood. There is 5 mm left-to-right midline shift.     Chest Port 1 View 11/30/14 - No acute chest findings. Mild basilar atelectasis.  11/26/2014    IMPRESSION: Prominence of the pulmonary interstitial markings and probable small left pleural effusion. Without previous studies it is not possible no with these findings are acute or old. A PA and lateral chest x-ray with deep inspiratory effort would be useful.    2D Echocardiogram  Left ventricle: The cavity size was normal. There was moderate concentric hypertrophy. Systolic function was normal. The estimated ejection fraction was in the range of 55% to 60%. Wall motion was normal; there were no regional wall motion abnormalities. Doppler parameters are consistent with abnormal left ventricular relaxation (grade 1 diastolic dysfunction). - Mitral valve: Mildly to moderately calcified annulus. - Left atrium: The atrium was moderately dilated. Impressions: - No cardiac source of emboli was indentified.  EKG  normal sinus rhythm. For complete results please see formal report.  EEG This EEG is consistent with a zone of  potential epileptogenicity in the left parietal region. There was no seizure recorded on this study.   PHYSICAL EXAM  Temp:  [98 F (36.7 C)-100.7 F (38.2 C)] 99.1 F (37.3 C) (05/27 1129) Pulse Rate:  [72-130] 77 (05/27 1200) Resp:  [18-35] 18 (05/27 1200) BP: (112-206)/(62-120) 112/62 mmHg (05/27 1200) SpO2:  [92 %-99 %] 96 % (05/27 1200)  General - Well nourished, well developed, mild distress with respiration, tachypnic.  Ophthalmologic - not cooperative on exam.  Cardiovascular - Regular rate and rhythm with no murmur.  Neuro - awake alert today, eyes open spontaneously, but not following simple commands, nonverbal. PERRL, eyes in the middle position, but attending to left, not blinking to visual threat on the right, right facial droop. Right UE 0/5 and RLE no spontaneous movement but 2+/5 on pain stimulation. LUE and LLE spontaneous movement and localizing to pain. Right babinski positive. Reflex 1+. Sensation, coordination and gait not tested.  ASSESSMENT/PLAN Ms. SHARESA KEMP is a 69 y.o. female with history of HTN admitted for left BG hemorrhage with intraventricular extension. Symptoms stabilized and then transferred to floor. Found to have decreased mental status, repeat CT showed hydrocephalus. Transfer back to ICU for EVD placement.  Intraventricular hemorrhage s/p EVD  Currently drainage well from EVD  NSG Dr. Jordan Likes is on board  intraventricular TPA 12/04/14.  Repeat CT head this morning showed significant improvement of intraventricular hemorrhage.  Dr. Jordan Likes would like repeat CT next Tuesday (order written)  Left BG hemorrhage with intraventricular extension -  likely due to hypertensive bleed  CT showed left BG hemorrhage with ventricular extension  Repeat CT showed stable hematoma  Repeat CT head 5/22 showed obstructive hydrocephalus  2D Echo  unremarkable  LDL 139, not at the goal  [id number] 5.7  Subcutaneous heparin  for VTE prophylaxis  no  antithrombotic prior to admission, now on no antithrombotic  BP control with goal < 160  Ongoing aggressive stroke risk factor management  Therapy recommendations:  pending  Disposition:  Pending  Obstructive hydrocephalus  Showing on the repeat CT 11/30/14   Likely to explain her lethargy and clinical decline 11/30/14  EVD placed  Currently drainage well  Repeat CT showed hydrocephalus improved  Respiratory distress  Stridor after extubation  More respiratory distress his morning  Put on NT-suctioning  Chest PT  Mucomyst nebulization  CXR stable  ? Seizure   Family report some eye deviation to right and left hand tremor  EEG left parietal sharps. 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 Continue lisinopril and hydralazine  Added clonidine 0.1 tid BP goal < 160  Hyperlipidemia  Home meds:  none   Currently on none  LDL 139, goal < 70  on lipitor 20  Continue statin at discharge  UTI - resolved  UA showed WBC too numerous to count  Put on cipro  urine cultures E Coli and sensitive to cipro  Repeat UA clear after antibiotics course  Other Stroke Risk Factors  Advanced age  ETOH use - 2-3 drinks daily  Other Active Problems  Leukocytosis - resolved  Other Pertinent History  Not check BP at home  Hospital day # 9  This patient is critically ill due to recurrent intraventricular hemorrhage, left basal ganglia hemorrhage with ventricular extension, strider post extubation and at significant risk of neurological  worsening, death form recurrent hemorrhage, obstructive hydrocephalus, cerebral edema, brain herniation, seizure, and cardiopulmonary failure. This patient's care requires constant monitoring of vital signs, hemodynamics, respiratory and cardiac monitoring, review of multiple databases, neurological assessment, discussion with family, other specialists and medical decision making of high complexity. I spent 35 minutes of neurocritical care time in the care of this patient.   Marvel PlanJindong Valley Ke, MD PhD Stroke Neurology 12/05/2014 12:47 PM   To contact Stroke Continuity provider, please refer to [email or web address]. After hours, contact General Neurology

## 2014-12-05 NOTE — Progress Notes (Signed)
Pt sleeping.  CPT held at this time.

## 2014-12-05 NOTE — Progress Notes (Signed)
Follow-up head CT scan this morning looks dramatically better with regard to amount of intraventricular blood. Blood has essentially completely cleared from her anterior lateral ventricles, third ventricle, and her fourth ventricle. There is still some blood in her trigone and occipital horns but this does not look like an organized clot but rather layered liquefied blood which should draining through the tube. I do not see the need for further TPA at this point.  Neurologically she is doing about the same. She is awake appears somewhat aware but is completely aphasic. Still with minimal movement of her right upper extremity. Still with purposeful movement on her left. Pupils are equal. On a worrisome note she is breathing a little bit more labored and doesn't seem to be managing her pulmonary secretions very well.  Overall stable. Continue external ventricular drainage through the weekend. Follow-up head CT scan Tuesday morning.

## 2014-12-05 NOTE — Progress Notes (Signed)
SLP Cancellation Note  Patient Details Name: Conard NovakChantal A Sebo MRN: [id number] DOB: 10/21/1945   Cancelled treatment:       Reason Eval/Treat Not Completed: Fatigue/lethargy limiting ability to participate.  Not sufficiently alert for participation.  D/W daughters - will f/u next date to assess readiness.   Blenda MountsCouture, Katasha Riga Laurice 12/05/2014, 9:31 AM

## 2014-12-06 LAB — BASIC METABOLIC PANEL
Anion gap: 7 (ref 5–15)
BUN: 28 mg/dL — ABNORMAL HIGH (ref 6–20)
CALCIUM: 8.7 mg/dL — AB (ref 8.9–10.3)
CO2: 28 mmol/L (ref 22–32)
CREATININE: 0.49 mg/dL (ref 0.44–1.00)
Chloride: 103 mmol/L (ref 101–111)
GFR calc Af Amer: >60 mL/min (ref >60)
GFR calc non Af Amer: >60 mL/min (ref >60)
Glucose, Bld: 203 mg/dL — ABNORMAL HIGH (ref 65–99)
POTASSIUM: 3.8 mmol/L (ref 3.5–5.1)
Sodium: 138 mmol/L (ref 135–145)

## 2014-12-06 LAB — CBC
HCT: 34.9 % — ABNORMAL LOW (ref 36.0–46.0)
Hemoglobin: 11.3 g/dL — ABNORMAL LOW (ref 12.0–15.0)
MCH: 32.5 pg (ref 26.0–34.0)
MCHC: 32.4 g/dL (ref 30.0–36.0)
MCV: 100.3 fL — ABNORMAL HIGH (ref 78.0–100.0)
Platelets: 238 K/uL (ref 150–400)
RBC: 3.48 MIL/uL — ABNORMAL LOW (ref 3.87–5.11)
RDW: 12.5 % (ref 11.5–15.5)
WBC: 8.9 K/uL (ref 4.0–10.5)

## 2014-12-06 MED ORDER — AMLODIPINE BESYLATE 5 MG PO TABS
5.0000 mg | ORAL_TABLET | Freq: Every day | ORAL | Status: DC
  Administered 2014-12-06 – 2014-12-07 (×2): 5 mg via ORAL
  Filled 2014-12-06 (×2): qty 1

## 2014-12-06 NOTE — Progress Notes (Signed)
STROKE TEAM PROGRESS NOTE   SUBJECTIVE (INTERVAL HISTORY) No family is at the bedside. Her EVD drainage continues to drain and 10-15cc/h. Had intraventricular TPA treatment yesterday. Repeat CT today showed much decreased intraventricular blood product. Patient neurologically stable, no significant change from yesterday. More respiratory distress, started on chest PT and Mucomyst nebulization.  Elevated BP, Started amplodipine 5 daily.     OBJECTIVE Temp:  [98.2 F (36.8 C)-100.2 F (37.9 C)] 99.3 F (37.4 C) (05/28 0800) Pulse Rate:  [40-117] 91 (05/28 1030) Cardiac Rhythm:  [-] Normal sinus rhythm (05/28 0800) Resp:  [18-37] 33 (05/28 1030) BP: (112-197)/(62-90) 185/81 mmHg (05/28 1030) SpO2:  [92 %-100 %] 94 % (05/28 1030)   Recent Labs Lab 12/05/14 0421 12/05/14 0424 12/05/14 0427 12/05/14 0725 12/05/14 1127  GLUCAP 55* 282* 199* 214* 198*    Recent Labs Lab 12/02/14 0415 12/03/14 0520 12/04/14 0535 12/05/14 0804 12/06/14 0535  NA 139 136 135 137 138  K 3.9 3.2* 3.7 3.6 3.8  CL 109 98* 99* 101 103  CO2 23 27 27 28 28   GLUCOSE 188* 175* 258* 215* 203*  BUN 23* 14 28* 25* 28*  CREATININE 0.47 0.50 0.51 0.52 0.49  CALCIUM 8.8* 9.3 9.4 9.1 8.7*    Recent Labs Lab 11/30/14 1520  AST 22  ALT 23  ALKPHOS 56  BILITOT 1.2  PROT 7.0  ALBUMIN 3.8    Recent Labs Lab 12/01/14 0600 12/02/14 0415 12/03/14 0520 12/05/14 0804 12/06/14 0535  WBC 11.6* 9.3 8.8 10.8* 8.9  HGB 11.7* 10.5* 11.9* 12.6 11.3*  HCT 35.1* 31.9* 35.1* 38.4 34.9*  MCV 98.0 100.6* 97.8 99.0 100.3*  PLT 202 176 227 285 238   No results for input(s): CKTOTAL, CKMB, CKMBINDEX, TROPONINI in the last 168 hours. No results for input(s): LABPROT, INR in the last 72 hours.  Recent Labs  12/03/14 1608  COLORURINE YELLOW  LABSPEC 1.014  PHURINE 6.0  GLUCOSEU 100*  HGBUR MODERATE*  BILIRUBINUR NEGATIVE  KETONESUR 40*  PROTEINUR NEGATIVE  UROBILINOGEN 1.0  NITRITE NEGATIVE   LEUKOCYTESUR NEGATIVE       Component Value Date/Time   CHOL 226* 11/26/2014 1200   TRIG 47 11/26/2014 1200   HDL 78 11/26/2014 1200   CHOLHDL 2.9 11/26/2014 1200   VLDL 9 11/26/2014 1200   LDLCALC 139* 11/26/2014 1200   Lab Results  Component Value Date   HGBA1C 5.7* 11/26/2014   No results found for: LABOPIA, COCAINSCRNUR, LABBENZ, AMPHETMU, THCU, LABBARB  No results for input(s): ETH in the last 168 hours.  I have personally reviewed the radiological images below and agree with the radiology interpretations.  Ct Head (brain) Wo Contrast 12/05/14 - 1. No significant interval change in size of evolving left thalamic hematoma with intraventricular extension. Overall, amount of intraventricular hemorrhage is decreased from previous with slightly decreased ventricular size. Right frontal approach ventriculostomy catheter in stable position. 2. Slightly increased prominence of scattered subarachnoid hemorrhage within the bilateral temporoparietal regions, favored to be related to redistribution. No definite new hemorrhage. 3. Similar size of right holohemispheric subdural hematoma measuring up to 9 mm. Associated right-to-left midline shift slightly improved at 2 mm (previously 4 mm).  12/03/14 - Evolving LEFT thalamic hematoma with intraventricular extension, similar intraventricular blood products, mild improved residual hydrocephalus with RIGHT frontal ventriculostomy catheter in place. Similar blood products along the catheter tract. Stable small RIGHT holohemispheric subdural hematoma measuring up to 9 mm. Similar 4 mm RIGHT to LEFT midline shift.  12/01/14 - When  compared to the most recent examination, minimal decrease in amount of pneumocephalus and minimal increase in size of hematoma centered in the left thalamic region as detailed above. The amount of intraventricular blood, size of the broad-based right subdural hematoma and degree of enlargement of the  lateral ventricles is relatively similar to most recent exam.  11/30/14 - Interval placement of ventriculostomy catheter via new RIGHT frontal burr hole with distal tip at the foramen of Monro, slightly decreased hydrocephalus. New 6 mm RIGHT holohemispheric acute subdural hematoma, a 4 mm RIGHT to LEFT midline shift (previously 4 mm LEFT to RIGHT midline shift). Evolving LEFT thalamic intraparenchymal hematoma. Increasing hemorrhagic intraventricular extension.  11/30/14 - Left thalamic hemorrhage and intraventricular hemorrhage unchanged in volume. Progressive ventricular enlargement compatible with obstructive hydrocephalus at the level of the aqueduct.  11/26/14 - Unchanged left thalamic hemorrhage with intraventricular extension.  11/26/2014   IMPRESSION: Acute 3.7 cm bleed in the left basal ganglia with decompression into the ventricles, moderate volume of intraventricular blood. There is 5 mm left-to-right midline shift.     Chest Port 1 View 11/30/14 - No acute chest findings. Mild basilar atelectasis.  11/26/2014    IMPRESSION: Prominence of the pulmonary interstitial markings and probable small left pleural effusion. Without previous studies it is not possible no with these findings are acute or old. A PA and lateral chest x-ray with deep inspiratory effort would be useful.    2D Echocardiogram  Left ventricle: The cavity size was normal. There was moderate concentric hypertrophy. Systolic function was normal. The estimated ejection fraction was in the range of 55% to 60%. Wall motion was normal; there were no regional wall motion abnormalities. Doppler parameters are consistent with abnormal left ventricular relaxation (grade 1 diastolic dysfunction). - Mitral valve: Mildly to moderately calcified annulus. - Left atrium: The atrium was moderately dilated. Impressions: - No cardiac source of emboli was indentified.  EKG  normal sinus rhythm. For complete results  please see formal report.  EEG This EEG is consistent with a zone of potential epileptogenicity in the left parietal region. There was no seizure recorded on this study.   PHYSICAL EXAM  Temp:  [98.2 F (36.8 C)-100.2 F (37.9 C)] 99.3 F (37.4 C) (05/28 0800) Pulse Rate:  [40-117] 91 (05/28 1030) Resp:  [18-37] 33 (05/28 1030) BP: (112-197)/(62-90) 185/81 mmHg (05/28 1030) SpO2:  [92 %-100 %] 94 % (05/28 1030)  General - Well nourished, well developed, mild distress with respiration, tachypnic.  Ophthalmologic - not cooperative on exam.  Cardiovascular - Regular rate and rhythm with no murmur.  Neuro - awake alert today, eyes open spontaneously, but not following simple commands, nonverbal. PERRL, eyes in the middle position, but attending to left, not blinking to visual threat on the right, right facial droop. Right UE 0/5 and RLE no spontaneous movement but 2+/5 on pain stimulation. LUE and LLE spontaneous movement and localizing to pain. Right babinski positive. Reflex 1+. Sensation, coordination and gait not tested.  ASSESSMENT/PLAN Kathryn Khan is a 69 y.o. female with history of HTN admitted for left BG hemorrhage with intraventricular extension. Symptoms stabilized and then transferred to floor. Found to have decreased mental status, repeat CT showed hydrocephalus. Transfer back to ICU for EVD placement.  Intraventricular hemorrhage s/p EVD  Currently drainage well from EVD  NSG Dr. Jordan Likes is on board  intraventricular TPA 12/04/14.  Repeat CT head showed significant improvement of intraventricular hemorrhage.  EVD draining well 8-15cc/hr ICP not monitor  CTH  repeat on Tuesday    Left BG hemorrhage with intraventricular extension - likely due to hypertensive bleed  CT showed left BG hemorrhage with ventricular extension  Repeat CT showed stable hematoma  Repeat CT head 5/22 showed obstructive hydrocephalus  2D Echo  unremarkable  LDL 139, not at the  goal  HgbA1c 5.7  Subcutaneous heparin  for VTE prophylaxis  no antithrombotic prior to admission, now on no antithrombotic  BP control with goal < 160  Ongoing aggressive stroke risk factor management  Therapy recommendations:  pending  Disposition:  Pending  Obstructive hydrocephalus  Showing on the repeat CT 11/30/14   Likely to explain her lethargy and clinical decline 11/30/14  EVD placed  Currently drainage well  Repeat CT showed hydrocephalus improved  Respiratory distress  Stridor after extubation  More respiratory distress his morning  Put on NT-suctioning  Chest PT  Mucomyst nebulization  CXR stable  ? Seizure   Family report some eye deviation to right and left hand tremor  EEG left parietal sharps. Already on keppra  on keppra for seizure prevention, will continue   Hyponatremia  Na 131->132->133->135->136->134->139->136->135->137-->138  On oral salt tablet  continue oral salt 2g tid  Close monitoring  Hyperglycemia  SSI  Likely due to tube feeding  CBG monitoring  Added Lantus 10 units daily at bedtime  Hypertension  Home meds:   Lisinopril, metoprolol BP still high Put on cleviprex drip, wean off as able. Continue lisinopril and hydralazine  Added clonidine 0.1 tid BP goal < 160  Hyperlipidemia  Home meds:  none   Currently on none  LDL 139, goal < 70  on lipitor 20  Continue statin at discharge  UTI - resolved  UA showed WBC too numerous to count  Put on cipro  urine cultures E Coli and sensitive to cipro  Repeat UA clear after antibiotics course  Other Stroke Risk Factors  Advanced age  ETOH use - 2-3 drinks daily  Other Active Problems  Leukocytosis - resolved  Other Pertinent History  Not check BP at home  Hospital day # 10  This patient is critically ill due to recurrent intraventricular hemorrhage, left basal ganglia hemorrhage with ventricular extension, strider post extubation and  at significant risk of neurological worsening, death form recurrent hemorrhage, obstructive hydrocephalus, cerebral edema, brain herniation, seizure, and cardiopulmonary failure. This patient's care requires constant monitoring of vital signs, hemodynamics, respiratory and cardiac monitoring, review of multiple databases, neurological assessment, discussion with family, other specialists and medical decision making of high complexity. I spent 35 minutes of neurocritical care time in the care of this patient.   Pauletta Browns   12/06/2014 11:49 AM   To contact Stroke Continuity provider, please refer to [email or web address]. After hours, contact General Neurology

## 2014-12-06 NOTE — Progress Notes (Signed)
**Note De-Identified  Obfuscation** CPT not done due to patient sleeping.  RRT to continue to monitor.

## 2014-12-06 NOTE — Progress Notes (Signed)
Patient ID: Kathryn Khan, female   DOB: 11/08/1945, 69 y.o.   MRN: [id number] BP 185/81 mmHg  Pulse 91  Temp(Src) 99.3 F (37.4 C) (Oral)  Resp 33  Ht 5\' 4"  (1.626 m)  Wt 72.7 kg (160 lb 4.4 oz)  BMI 27.50 kg/m2  SpO2 94% Alert, not following commands Perrl, Drain with bloody fluid, draining well Holding steady.

## 2014-12-06 NOTE — Progress Notes (Signed)
Speech Language Pathology Treatment: Dysphagia  Patient Details Name: Kathryn NovakChantal A Khan MRN: [id number] DOB: 10/30/1945 Today's Date: 12/06/2014 Time: [id number] SLP Time Calculation (min) (ACUTE ONLY): 8 min  Assessment / Plan / Recommendation Clinical Impression  Pt is lethargic this afternoon and unable to be sufficiently aroused for PO trials despite Max multimodal cueing from SLP. RN and son both report increased alertness earlier in the day. Oral care performed minimally, as pt kept her teeth clenched. Presentation of empty spoon and ice chips for thermal/tactile stimulation was not effective at eliciting any volitional or reflexive oral movements. Will continue to follow for diagnostic dysphagia treatment.   HPI Other Pertinent Information: 69 y.o. female with history of HTN admitted for left BG hemorrhage with intraventricular extension.  Initial BSE on 11/27/14 with recommendation for Dys 2 with Nectar thick liquids.  Pt became more lethargic on 5/21 and required intubation on 5/22 and was extubated on 5/24.     Pertinent Vitals Pain Assessment: Faces Faces Pain Scale: No hurt  SLP Plan  Continue with current plan of care    Recommendations Diet recommendations: NPO Medication Administration: Via alternative means       Oral Care Recommendations: Oral care QID Plan: Continue with current plan of care    Kathryn Khan, M.A. CCC-SLP [phone]  Kathryn Hamaiewonsky, Kathryn Khan 12/06/2014, 1:52 PM

## 2014-12-07 DIAGNOSIS — G936 Cerebral edema: Secondary | ICD-10-CM | POA: Insufficient documentation

## 2014-12-07 LAB — CBC
HEMATOCRIT: 34.5 % — AB (ref 36.0–46.0)
Hemoglobin: 11.2 g/dL — ABNORMAL LOW (ref 12.0–15.0)
MCH: 32.4 pg (ref 26.0–34.0)
MCHC: 32.5 g/dL (ref 30.0–36.0)
MCV: 99.7 fL (ref 78.0–100.0)
PLATELETS: 247 10*3/uL (ref 150–400)
RBC: 3.46 MIL/uL — AB (ref 3.87–5.11)
RDW: 12.4 % (ref 11.5–15.5)
WBC: 11.3 10*3/uL — AB (ref 4.0–10.5)

## 2014-12-07 LAB — BASIC METABOLIC PANEL
ANION GAP: 6 (ref 5–15)
BUN: 21 mg/dL — AB (ref 6–20)
CALCIUM: 8.6 mg/dL — AB (ref 8.9–10.3)
CO2: 29 mmol/L (ref 22–32)
CREATININE: 0.49 mg/dL (ref 0.44–1.00)
Chloride: 101 mmol/L (ref 101–111)
GFR calc non Af Amer: >60 mL/min (ref >60)
Glucose, Bld: 194 mg/dL — ABNORMAL HIGH (ref 65–99)
Potassium: 3.7 mmol/L (ref 3.5–5.1)
SODIUM: 136 mmol/L (ref 135–145)

## 2014-12-07 MED ORDER — AMLODIPINE BESYLATE 10 MG PO TABS
10.0000 mg | ORAL_TABLET | Freq: Every day | ORAL | Status: DC
  Administered 2014-12-08 – 2014-12-09 (×2): 10 mg via ORAL
  Filled 2014-12-07 (×3): qty 1

## 2014-12-07 NOTE — Progress Notes (Signed)
Speech Language Pathology Treatment: Dysphagia  Patient Details Name: Conard NovakChantal A Swearingin MRN: [id number] DOB: 08/31/1945 Today's Date: 12/07/2014 Time: [id number] SLP Time Calculation (min) (ACUTE ONLY): 20 min  Assessment / Plan / Recommendation Clinical Impression  Pt reportedly more alert this a.m.- opened eyes and looked at therapist when talking- pt is still not following directions except for opening mouth with max cues. Pt resistant to oral care and attempted to push swab away from mouth. Family at bedside reported that oral care was recently provided. Pt accepted ice chip but no attempts to move bolus around in mouth or to swallow- oral suction used to remove bolus. Attempted very small amount of applesauce- pt moved bolus around in mouth and laryngeal muscles elevated slightly upon palpation as if attempting to initiate swallow- again removed bolus with suction. Pt had a delayed cough x1 following puree bolus. Pt still at high risk of aspiration with weak cough and inability to initiate pharyngeal swallow. Recommend continuing strict NPO status with frequent oral care, meds via alternative means. Will continue to follow for PO trials.    HPI Other Pertinent Information: 69 y.o. female with history of HTN admitted for left BG hemorrhage with intraventricular extension.  Initial BSE on 11/27/14 with recommendation for Dys 2 with Nectar thick liquids.  Pt became more lethargic on 5/21 and required intubation on 5/22 and was extubated on 5/24.     Pertinent Vitals Pain Assessment: Faces Faces Pain Scale: No hurt  SLP Plan  Continue with current plan of care    Recommendations Diet recommendations: NPO Medication Administration: Via alternative means              Oral Care Recommendations: Oral care QID Follow up Recommendations: Inpatient Rehab Plan: Continue with current plan of care    GO     [place]ro KungOleksiak, Darilyn Storbeck K, MA, CCC-SLP 12/07/2014, 9:55 AM

## 2014-12-07 NOTE — Progress Notes (Signed)
LCSW following for disposition of patient. Patient was from home and most likely will need PT evaluation and recommendations ?SNF. Will complete full assessment when patient can participate and appropriate.  Following for needs.  Kathryn EmoryHannah Meaghann Choo LCSW, MSW Clinical Social Work: Emergency Room [phone]

## 2014-12-07 NOTE — Progress Notes (Signed)
Patient ID: Kathryn Khan, female   DOB: 11/14/1945, 69 y.o.   MRN: [id number] Stable. ivc working well. Quite bloody csf

## 2014-12-07 NOTE — Progress Notes (Addendum)
Patient assessment done and patient scored a 6. NO need for further Mucomyst treatment at this time. X-ray showed no acute findings, and will reassess as needed per protocol. Continuing Prn albuterol for SOB

## 2014-12-07 NOTE — Progress Notes (Signed)
STROKE TEAM PROGRESS NOTE   SUBJECTIVE (INTERVAL HISTORY)  family is at the bedside. Her EVD drainage continues to drain and 10-15cc/h. Had intraventricular TPA treatment 12/05/14. Repeat CT  12/05/14 showed much decreased intraventricular blood product. Patient neurologically stable, no significant change from yesterday.   started on chest PT and Mucomyst nebulization.  Elevated BP, Started amlodipine 5 mg 12/06/14 by PCCM and try to wean cleviprex    OBJECTIVE Temp:  [98.2 F (36.8 C)-100.8 F (38.2 C)] 100.8 F (38.2 C) (05/29 1345) Pulse Rate:  [56-124] 86 (05/29 1200) Cardiac Rhythm:  [-] Normal sinus rhythm (05/29 0800) Resp:  [19-37] 37 (05/29 1200) BP: (122-171)/(61-88) 148/74 mmHg (05/29 1200) SpO2:  [91 %-98 %] 95 % (05/29 1200)   Recent Labs Lab 12/05/14 0421 12/05/14 0424 12/05/14 0427 12/05/14 0725 12/05/14 1127  GLUCAP 55* 282* 199* 214* 198*    Recent Labs Lab 12/03/14 0520 12/04/14 0535 12/05/14 0804 12/06/14 0535 12/07/14 0440  NA 136 135 137 138 136  K 3.2* 3.7 3.6 3.8 3.7  CL 98* 99* 101 103 101  CO2 27 27 28 28 29   GLUCOSE 175* 258* 215* 203* 194*  BUN 14 28* 25* 28* 21*  CREATININE 0.50 0.51 0.52 0.49 0.49  CALCIUM 9.3 9.4 9.1 8.7* 8.6*    Recent Labs Lab 11/30/14 1520  AST 22  ALT 23  ALKPHOS 56  BILITOT 1.2  PROT 7.0  ALBUMIN 3.8    Recent Labs Lab 12/02/14 0415 12/03/14 0520 12/05/14 0804 12/06/14 0535 12/07/14 0440  WBC 9.3 8.8 10.8* 8.9 11.3*  HGB 10.5* 11.9* 12.6 11.3* 11.2*  HCT 31.9* 35.1* 38.4 34.9* 34.5*  MCV 100.6* 97.8 99.0 100.3* 99.7  PLT 176 227 285 238 247   No results for input(s): CKTOTAL, CKMB, CKMBINDEX, TROPONINI in the last 168 hours. No results for input(s): LABPROT, INR in the last 72 hours. No results for input(s): COLORURINE, LABSPEC, PHURINE, GLUCOSEU, HGBUR, BILIRUBINUR, KETONESUR, PROTEINUR, UROBILINOGEN, NITRITE, LEUKOCYTESUR in the last 72 hours.  Invalid input(s): APPERANCEUR     Component  Value Date/Time   CHOL 226* 11/26/2014 1200   TRIG 47 11/26/2014 1200   HDL 78 11/26/2014 1200   CHOLHDL 2.9 11/26/2014 1200   VLDL 9 11/26/2014 1200   LDLCALC 139* 11/26/2014 1200   Lab Results  Component Value Date   HGBA1C 5.7* 11/26/2014   No results found for: LABOPIA, COCAINSCRNUR, LABBENZ, AMPHETMU, THCU, LABBARB  No results for input(s): ETH in the last 168 hours.  I have personally reviewed the radiological images below and agree with the radiology interpretations.  Ct Head (brain) Wo Contrast 12/05/14 - 1. No significant interval change in size of evolving left thalamic hematoma with intraventricular extension. Overall, amount of intraventricular hemorrhage is decreased from previous with slightly decreased ventricular size. Right frontal approach ventriculostomy catheter in stable position. 2. Slightly increased prominence of scattered subarachnoid hemorrhage within the bilateral temporoparietal regions, favored to be related to redistribution. No definite new hemorrhage. 3. Similar size of right holohemispheric subdural hematoma measuring up to 9 mm. Associated right-to-left midline shift slightly improved at 2 mm (previously 4 mm).  12/03/14 - Evolving LEFT thalamic hematoma with intraventricular extension, similar intraventricular blood products, mild improved residual hydrocephalus with RIGHT frontal ventriculostomy catheter in place. Similar blood products along the catheter tract. Stable small RIGHT holohemispheric subdural hematoma measuring up to 9 mm. Similar 4 mm RIGHT to LEFT midline shift.  12/01/14 - When compared to the most recent examination, minimal decrease in amount  of pneumocephalus and minimal increase in size of hematoma centered in the left thalamic region as detailed above. The amount of intraventricular blood, size of the broad-based right subdural hematoma and degree of enlargement of the lateral ventricles is relatively similar to most  recent exam.  11/30/14 - Interval placement of ventriculostomy catheter via new RIGHT frontal burr hole with distal tip at the foramen of Monro, slightly decreased hydrocephalus. New 6 mm RIGHT holohemispheric acute subdural hematoma, a 4 mm RIGHT to LEFT midline shift (previously 4 mm LEFT to RIGHT midline shift). Evolving LEFT thalamic intraparenchymal hematoma. Increasing hemorrhagic intraventricular extension.  11/30/14 - Left thalamic hemorrhage and intraventricular hemorrhage unchanged in volume. Progressive ventricular enlargement compatible with obstructive hydrocephalus at the level of the aqueduct.  11/26/14 - Unchanged left thalamic hemorrhage with intraventricular extension.  11/26/2014   IMPRESSION: Acute 3.7 cm bleed in the left basal ganglia with decompression into the ventricles, moderate volume of intraventricular blood. There is 5 mm left-to-right midline shift.     Chest Port 1 View 11/30/14 - No acute chest findings. Mild basilar atelectasis.  11/26/2014    IMPRESSION: Prominence of the pulmonary interstitial markings and probable small left pleural effusion. Without previous studies it is not possible no with these findings are acute or old. A PA and lateral chest x-ray with deep inspiratory effort would be useful.    2D Echocardiogram  Left ventricle: The cavity size was normal. There was moderate concentric hypertrophy. Systolic function was normal. The estimated ejection fraction was in the range of 55% to 60%. Wall motion was normal; there were no regional wall motion abnormalities. Doppler parameters are consistent with abnormal left ventricular relaxation (grade 1 diastolic dysfunction). - Mitral valve: Mildly to moderately calcified annulus. - Left atrium: The atrium was moderately dilated. Impressions: - No cardiac source of emboli was indentified.  EKG  normal sinus rhythm. For complete results please see formal report.  EEG This EEG is consistent  with a zone of potential epileptogenicity in the left parietal region. There was no seizure recorded on this study.   PHYSICAL EXAM  Temp:  [98.2 F (36.8 C)-100.8 F (38.2 C)] 100.8 F (38.2 C) (05/29 1345) Pulse Rate:  [56-124] 86 (05/29 1200) Resp:  [19-37] 37 (05/29 1200) BP: (122-171)/(61-88) 148/74 mmHg (05/29 1200) SpO2:  [91 %-98 %] 95 % (05/29 1200)  General - Well nourished, well developed, not in any acute distress.  Ophthalmologic - not cooperative on exam.  Cardiovascular - Regular rate and rhythm with no murmur.  Neuro - awake alert today, eyes open spontaneously, but not following simple commands, Aphasic mute,nonverbal. PERRL, eyes in the midline position, but attending to left, not blinking to visual threat on the right, right facial droop. Right UE 0/5 and RLE no spontaneous movement but 2+/5 on pain stimulation. LUE and LLE spontaneous movement and localizing to pain. Right babinski positive. Reflex 1+. Sensation, coordination and gait not tested.  ASSESSMENT/PLAN Ms. Kathryn Khan is a 69 y.o. female with history of HTN admitted for left BG hemorrhage with intraventricular extension. Symptoms stabilized and then transferred to floor. Found to have decreased mental status, repeat CT showed hydrocephalus. Transfer back to ICU for EVD placement.  Intraventricular hemorrhage s/p EVD  Currently drainage well from EVD  NSG Dr. Jordan Likes is on board  intraventricular TPA 12/04/14.  Repeat CT head showed significant improvement of intraventricular hemorrhage.  EVD draining well 8-15cc/hr ICP not monitor  CTH repeat on Tuesday    Left BG hemorrhage  with intraventricular extension - likely due to hypertensive bleed  CT showed left BG hemorrhage with ventricular extension  Repeat CT showed stable hematoma  Repeat CT head 5/22 showed obstructive hydrocephalus  2D Echo  unremarkable  LDL 139, not at the goal  [id number] 5.7  Subcutaneous heparin  for VTE  prophylaxis  no antithrombotic prior to admission, now on no antithrombotic  BP control with goal < 160  Ongoing aggressive stroke risk factor management  Therapy recommendations:  pending  Disposition:  Pending  Obstructive hydrocephalus  Showing on the repeat CT 11/30/14   Likely to explain her lethargy and clinical decline 11/30/14  EVD placed  Currently drainage well  Repeat CT showed hydrocephalus improved  Respiratory distress  Stridor after extubation  More respiratory distress his morning  Put on NT-suctioning  Chest PT  Mucomyst nebulization  CXR stable  ? Seizure   Family report some eye deviation to right and left hand tremor  EEG left parietal sharps. Already on keppra  on keppra for seizure prevention, will continue   Hyponatremia  Na 131->132->133->135->136->134->139->136->135->137-->138  On oral salt tablet  continue oral salt 2g tid  Close monitoring  Hyperglycemia  SSI  Likely due to tube feeding  CBG monitoring  Added Lantus 10 units daily at bedtime  Hypertension  Home meds:   Lisinopril, metoprolol BP still high Put on cleviprex drip, wean off as able. Continue lisinopril , amlodipine and hydralazine  Added clonidine 0.1 tid BP goal < 160  Hyperlipidemia  Home meds:  none   Currently on none  LDL 139, goal < 70  on lipitor 20  Continue statin at discharge  UTI - resolved  UA showed WBC too numerous to count  Put on cipro  urine cultures E Coli and sensitive to cipro  Repeat UA clear after antibiotics course  Other Stroke Risk Factors  Advanced age  ETOH use - 2-3 drinks daily  Other Active Problems  Leukocytosis - resolved  Other Pertinent History  Not check BP at home  Hospital day # 11  This patient is critically ill due to recurrent intraventricular hemorrhage, left basal ganglia hemorrhage with ventricular extension, strider post extubation and at significant risk of neurological  worsening, death form recurrent hemorrhage, obstructive hydrocephalus, cerebral edema, brain herniation, seizure, and cardiopulmonary failure. This patient's care requires constant monitoring of vital signs, hemodynamics, respiratory and cardiac monitoring, review of multiple databases, neurological assessment, discussion with family, other specialists and medical decision making of high complexity. I spent 32 minutes of neurocritical care time in the care of this patient.   SETHI,PRAMOD   12/07/2014 1:48 PM   To contact Stroke Continuity provider, please refer to [email or web address]. After hours, contact General Neurology

## 2014-12-07 NOTE — Progress Notes (Signed)
**Note De-Identified  Obfuscation** CPT not done; patient sleeping.

## 2014-12-08 ENCOUNTER — Inpatient Hospital Stay (HOSPITAL_COMMUNITY): Payer: Medicare HMO

## 2014-12-08 DIAGNOSIS — G936 Cerebral edema: Secondary | ICD-10-CM

## 2014-12-08 DIAGNOSIS — J189 Pneumonia, unspecified organism: Secondary | ICD-10-CM

## 2014-12-08 LAB — BASIC METABOLIC PANEL
ANION GAP: 8 (ref 5–15)
BUN: 19 mg/dL (ref 6–20)
CALCIUM: 8.6 mg/dL — AB (ref 8.9–10.3)
CO2: 29 mmol/L (ref 22–32)
CREATININE: 0.47 mg/dL (ref 0.44–1.00)
Chloride: 97 mmol/L — ABNORMAL LOW (ref 101–111)
GFR calc Af Amer: >60 mL/min (ref >60)
GFR calc non Af Amer: >60 mL/min (ref >60)
Glucose, Bld: 184 mg/dL — ABNORMAL HIGH (ref 65–99)
Potassium: 3.6 mmol/L (ref 3.5–5.1)
SODIUM: 134 mmol/L — AB (ref 135–145)

## 2014-12-08 LAB — CBC
HEMATOCRIT: 34.8 % — AB (ref 36.0–46.0)
Hemoglobin: 11.5 g/dL — ABNORMAL LOW (ref 12.0–15.0)
MCH: 32.4 pg (ref 26.0–34.0)
MCHC: 33 g/dL (ref 30.0–36.0)
MCV: 98 fL (ref 78.0–100.0)
PLATELETS: 276 10*3/uL (ref 150–400)
RBC: 3.55 MIL/uL — ABNORMAL LOW (ref 3.87–5.11)
RDW: 12.2 % (ref 11.5–15.5)
WBC: 12.4 10*3/uL — ABNORMAL HIGH (ref 4.0–10.5)

## 2014-12-08 LAB — LACTIC ACID, PLASMA: Lactic Acid, Venous: 0.7 mmol/L (ref 0.5–2.0)

## 2014-12-08 MED ORDER — VANCOMYCIN HCL IN DEXTROSE 750-5 MG/150ML-% IV SOLN
750.0000 mg | Freq: Two times a day (BID) | INTRAVENOUS | Status: DC
  Administered 2014-12-09 – 2014-12-10 (×3): 750 mg via INTRAVENOUS
  Filled 2014-12-08 (×4): qty 150

## 2014-12-08 MED ORDER — DEXAMETHASONE SODIUM PHOSPHATE 4 MG/ML IJ SOLN
4.0000 mg | Freq: Four times a day (QID) | INTRAMUSCULAR | Status: AC
  Administered 2014-12-08 – 2014-12-09 (×4): 4 mg via INTRAVENOUS
  Filled 2014-12-08 (×4): qty 1

## 2014-12-08 MED ORDER — SODIUM CHLORIDE 1 G PO TABS
1.0000 g | ORAL_TABLET | Freq: Three times a day (TID) | ORAL | Status: DC
  Administered 2014-12-08 – 2014-12-10 (×6): 1 g via ORAL
  Filled 2014-12-08 (×9): qty 1

## 2014-12-08 MED ORDER — DEXTROSE 5 % IV SOLN
2.0000 g | Freq: Three times a day (TID) | INTRAVENOUS | Status: DC
  Administered 2014-12-08 – 2014-12-10 (×5): 2 g via INTRAVENOUS
  Filled 2014-12-08 (×8): qty 2

## 2014-12-08 MED ORDER — SODIUM CHLORIDE 0.9 % IV SOLN
1500.0000 mg | Freq: Once | INTRAVENOUS | Status: AC
  Administered 2014-12-08: 1500 mg via INTRAVENOUS
  Filled 2014-12-08: qty 1500

## 2014-12-08 NOTE — Progress Notes (Signed)
PT Cancellation Note  Patient Details Name: Conard NovakChantal A Kittel MRN: [id number] DOB: 09/10/1945   Cancelled Treatment:    Reason Eval/Treat Not Completed: Medical issues which prohibited therapy.  Declined respiratory status this afternoon. 12/08/2014  Logan BingKen Tasha Diaz, PT [phone]  (pager)   Fusae Florio, Eliseo GumKenneth V 12/08/2014, 5:47 PM

## 2014-12-08 NOTE — Progress Notes (Signed)
ANTIBIOTIC CONSULT NOTE - INITIAL  Pharmacy Consult for vancomycin, aztreonam Indication: pneumonia  Allergies  Allergen Reactions  . Penicillins Anaphylaxis  . Codeine Nausea And Vomiting  . Sulfa Antibiotics     Patient Measurements: Height: 5\' 4"  (162.6 cm) Weight: 160 lb 4.4 oz (72.7 kg) IBW/kg (Calculated) : 54.7 Adjusted Body Weight:   Vital Signs: Temp: 100.9 F (38.3 C) (05/30 1105) Temp Source: Axillary (05/30 1105) BP: 163/83 mmHg (05/30 1407) Pulse Rate: 92 (05/30 1442) Intake/Output from previous day: 05/29 0701 - 05/30 0700 In: 2130 [I.V.:720; NG/GT:1200; IV Piggyback:210] Out: 2389 [Urine:2110; Drains:279] Intake/Output from this shift: Total I/O In: 690 [I.V.:210; NG/GT:375; IV Piggyback:105] Out: 661 [Urine:600; Drains:61]  Labs:  Recent Labs  12/06/14 0535 12/07/14 0440 12/08/14 0448  WBC 8.9 11.3* 12.4*  HGB 11.3* 11.2* 11.5*  PLT 238 247 276  CREATININE 0.49 0.49 0.47    Medical History: Past Medical History  Diagnosis Date  . Hypertension     Medications:  See EMR  Assessment: 69 yo female with L basal ganglia hemorrhage and IVH s/p frontal ventriculostomy. Initiating antibiotics for possible pneumonia. Recently finished course of ciprofloxacin for e.coli UTI.  Scr stable, mild leukocytosis, Tm 100.9.  Goal of Therapy:  Vancomycin trough level 15-20 mcg/ml  Plan:  Aztreonam 2 g IV q8h Vancomycin 1500 mg IV x1 Watch renal fx, cultures, duration of therapy    Agapito GamesAlison Dan Scearce, PharmD, BCPS Clinical Pharmacist Pager: [phone] 12/08/2014 3:06 PM

## 2014-12-08 NOTE — Progress Notes (Signed)
Patient ID: Kathryn Khan A Trim, female   DOB: 04/27/1946, 69 y.o.   MRN: [id number] ivc working well.

## 2014-12-08 NOTE — Progress Notes (Signed)
Pt shows no signs of respiratory distress. Bipap not needed at this time. Pt remains on 50% Venturi mask-SpO2 97% RR-24. RT will continue to monitor as needed.

## 2014-12-08 NOTE — Progress Notes (Signed)
Speech Language Pathology Treatment: Dysphagia  Patient Details Name: Kathryn Khan MRN: [id number] DOB: 10/20/1945 Today's Date: 12/08/2014 Time: 4098-11910748-0800 SLP Time Calculation (min) (ACUTE ONLY): 12 min  Assessment / Plan / Recommendation Clinical Impression  Skilled treatment focused on facilitation of oropharyngeal musculature and cognitive status for initiation of pos. Patient with eyes open but lethargic, unable to follow commands despite max verbal and tactile cues. Therapeutic Ice chip trials provided with limited oral movements, eventual oral transit following prolonged delay and max verbal and tactile cues with initiation of weak hyo-laryngeal movement and increased RR. Attempts to assess vocal quality unsuccessful. Patient remains at high risk of aspiration given lethargy and inability to initiate a timely pharyngeal swallow. Will continue to f/u.   HPI Other Pertinent Information: 69 y.o. female with history of HTN admitted for left BG hemorrhage with intraventricular extension.  Initial BSE on 11/27/14 with recommendation for Dys 2 with Nectar thick liquids.  Pt became more lethargic on 5/21 and required intubation on 5/22 and was extubated on 5/24.     Pertinent Vitals Pain Assessment: Faces Faces Pain Scale: No hurt  SLP Plan  Continue with current plan of care    Recommendations Diet recommendations: NPO Medication Administration: Via alternative means             Oral Care Recommendations: Oral care QID Follow up Recommendations: Inpatient Rehab Plan: Continue with current plan of care    GO   [facility] Kathryn Rarick MA, CCC-SLP [phone]   Toris Laverdiere Meryl 12/08/2014, 8:03 AM

## 2014-12-08 NOTE — Progress Notes (Signed)
PULMONARY / CRITICAL CARE MEDICINE   Name: Kathryn Khan MRN: [id number] DOB: 10/01/1945    ADMISSION DATE:  11/26/2014 CONSULTATION DATE:  5/23  REFERRING MD :  Tora KindredXu/Stroke   INITIAL PRESENTATION:  [age] F adm to stroke service 5/18 with acute L basal ganglia hemorrhage and IVH. Had progressive hydrocephalus and underwent R frontal ventriculostomy 5/22. Catheter clotted requiring replacement. Progressive obtundation required ETI 5/22 PM. PCCm asked to assist with vent and med mgmt PCCM re-consulted for resp distress 5/30 .   MAJOR EVENTS/TEST RESULTS: 5/18 Admission with L basal ganglia hemorrhagic CVA with IVH. R hemiplegia and aphasia on exam 5/18 CT head: Acute 3.7 cm bleed in the left basal ganglia with decompression into the ventricles, moderate volume of intraventricular blood. There is 5 mm left-to-right midline shift 5/18 repeat CT head: Unchanged left thalamic hemorrhage with intraventricular extension. 5/19 More awake alert and can repeat with answering some questions appropriately but severe dysarthria 5/20 Overall stable 5/21 Increased BP. Metoprolol initiated 5/22 AM: Rapid response call for decreased LOC. Low grade fever. Pyuria. Ciprofloxacin initiated 5/22 Stat CT head: Left thalamic hemorrhage and intraventricular hemorrhage unchanged in volume. 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 R frontal ventriculostomy placed  5/22 Intubated for depressed LOC 5/22 ventric drain malfunction. IVC replaced [name](Cram) 5/22 repeat CT head: Stable size of the left thalamic hematoma. Stable vasogenic edema and stable mild midline shift. Stable obstructive hydrocephalus with blood in the cerebral aqueduct 5/23 CT head: minimal decrease in amount of pneumocephalus and minimal increase in size of hematoma centered in the left thalamic region as detailed above. The amount of intraventricular blood, size  of the broad-based right subdural hematoma and degree of enlargement of the lateral ventricles is relatively similar to most recent exam 5/24 LOC improved. Passed SBT. Developed stridor approx one hour after extubation. Received nebulized racemic epi X 2 and methylpred initiated. PRN NPPV 5/25 Evolving LEFT thalamic hematoma with intraventricular extension, similar intraventricular blood products, mild improved residual hydrocephalus with RIGHT frontal ventriculostomy catheter in place 5/26 Stridor resolved. intraventricular tPA administered 5/26 PCCM signed off  5/30 PCCM reconsulted for resp distress   INDWELLING DEVICES:: ETT 5/22 >> 5/24 R IJ CVL 5/22 >>   MICRO DATA: MRSA PCR 5/18 >> NEG Urine 5/19 >> E coli C diff PCR 5/20 >> NEG  ANTIMICROBIALS:  Cipro 5/19 >> 5/24  SUBJECTIVE:  5/30  pt with increased wob , tachypnea , hypoxia   VITAL SIGNS: Temp:  [98.2 F (36.8 C)-100.9 F (38.3 C)] 100.9 F (38.3 C) (05/30 1105) Pulse Rate:  [60-147] 95 (05/30 1400) Resp:  [24-44] 44 (05/30 1400) BP: (126-172)/(54-90) 163/83 mmHg (05/30 1407) SpO2:  [91 %-98 %] 92 % (05/30 1416) HEMODYNAMICS:   VENTILATOR SETTINGS:   INTAKE / OUTPUT:  Intake/Output Summary (Last 24 hours) at 12/08/14 1441 Last data filed at 12/08/14 1400  Gross per 24 hour  Intake   2070 ml  Output   1975 ml  Net     95 ml    PHYSICAL EXAMINATION: General: chronically ill appearing , increased wob  Neuro:  EOMI, PERRL, R hemiplegia  HEENT: NCAT, ?stridor  Cardiovascular: Reg, no M Lungs: coarse rhonchi , +accessory use  Abdomen: Soft, NT, +BS Ext: warm, tr edema  LABS:  CBC  Recent Labs Lab 12/06/14 0535 12/07/14 0440 12/08/14 0448  WBC 8.9 11.3* 12.4*  HGB 11.3* 11.2* 11.5*  HCT 34.9* 34.5* 34.8*  PLT  238 247 276   Coag's No results for input(s): APTT, INR in the last 168 hours. BMET  Recent Labs Lab 12/06/14 0535 12/07/14 0440 12/08/14 0448  NA 138 136 134*  K 3.8 3.7 3.6  CL  103 101 97*  CO2 BUN 28* 21* 19  CREATININE 0.49 0.49 0.47  GLUCOSE 203* 194* 184*   Electrolytes  Recent Labs Lab 12/06/14 0535 12/07/14 0440 12/08/14 0448  CALCIUM 8.7* 8.6* 8.6*   Sepsis Markers No results for input(s): LATICACIDVEN, PROCALCITON, O2SATVEN in the last 168 hours. ABG No results for input(s): PHART, PCO2ART, PO2ART in the last 168 hours. Liver Enzymes No results for input(s): AST, ALT, ALKPHOS, BILITOT, ALBUMIN in the last 168 hours. Cardiac Enzymes No results for input(s): TROPONINI, PROBNP in the last 168 hours. Glucose  Recent Labs Lab 12/04/14 2348 12/05/14 0421 12/05/14 0424 12/05/14 0427 12/05/14 0725 12/05/14 1127  GLUCAP 224* 55* 282* 199* 214* 198*    CXR: pending     ASSESSMENT / PLAN:  PULMONARY A: VDRF due to AMS>resolved  Post extubation stridor - resolved 5/26  Acute Resp Distress ? Stridor component  ?Aspiration PNA /HCAP   P:   BIPAP support  Decadron  q6 x 4 dose  Check CXR  ABX see ID sxn  CARDIOVASCULAR A:  Hyperlipidemia - chronic statin  Htn - chronic beta blocker, ACEI Sinus bradycardia, resolved Intermittent Htn P:  SBP goal < 160 mmHg Cont clevidipine per stroke team Enteral antihypertensive meds per Stroke team  RENAL A:   Mild hypokalemia, >resolved  P:   Intermittent bmet   GASTROINTESTINAL A:  Dysphagia P:   SUP: enteral famotidine Hold TF for now   HEMATOLOGIC A:   Mild ICU acquired anemia without acute blood loss P:  DVT px: SCDs Intermittent cbc   INFECTIOUS A:   Uncomplicated UTI,  tx w/ aBX >resolved  Fever and elevated WBC   P:   CHeck cxr  Check lactate  BC x 2  Empiric ABX for aspiration PNA   ENDOCRINE A:   Hyperglycemia without prior dx of DM  Exacerbated by steroids P:   Cont sens scale SSI Goal glu < 180  NEUROLOGIC A:  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   Chart reviewed, extensive history just this admission.  We were called back today for stridor and increased work of breathing.  Stridor happened after extubation a few days ago, fortunately seems to be calming down now as there was minimal stridor on my exam.  However she has basilar crackles and likely a new RLL infiltrate on my read of the CXR from this afternoon.  Will keep NPO, BIPAP prn, add back steroids overnight for stridor (though now better) and add broad spectrum ABX for HCAP.  DNR status noted.  Heber Repton, MD [place] [facility] Pager: [phone] Cell: [phone] After 3pm or if no response, call [phone]

## 2014-12-08 NOTE — Progress Notes (Signed)
STROKE TEAM PROGRESS NOTE   SUBJECTIVE (INTERVAL HISTORY)  family is at the bedside. Her EVD drainage continues to drain and 10-15cc/h. Had intraventricular TPA treatment 12/05/14. Repeat CT  12/05/14 showed much decreased intraventricular blood product. Patient neurologically stable, no significant change from yesterday.  Baby better controlled now.  cleviprex weaned and off now.   OBJECTIVE Temp:  [98.2 F (36.8 C)-100.9 F (38.3 C)] 100.9 F (38.3 C) (05/30 1105) Pulse Rate:  [60-147] 95 (05/30 1400) Cardiac Rhythm:  [-] Normal sinus rhythm (05/30 1200) Resp:  [24-44] 44 (05/30 1400) BP: (126-172)/(54-90) 163/83 mmHg (05/30 1407) SpO2:  [91 %-98 %] 92 % (05/30 1416)   Recent Labs Lab 12/05/14 0421 12/05/14 0424 12/05/14 0427 12/05/14 0725 12/05/14 1127  GLUCAP 55* 282* 199* 214* 198*    Recent Labs Lab 12/04/14 0535 12/05/14 0804 12/06/14 0535 12/07/14 0440 12/08/14 0448  NA 135 137 138 136 134*  K 3.7 3.6 3.8 3.7 3.6  CL 99* 101 103 101 97*  CO2 GLUCOSE 258* 215* 203* 194* 184*  BUN 28* 25* 28* 21* 19  CREATININE 0.51 0.52 0.49 0.49 0.47  CALCIUM 9.4 9.1 8.7* 8.6* 8.6*   No results for input(s): AST, ALT, ALKPHOS, BILITOT, PROT, ALBUMIN in the last 168 hours.  Recent Labs Lab 12/03/14 0520 12/05/14 0804 12/06/14 0535 12/07/14 0440 12/08/14 0448  WBC 8.8 10.8* 8.9 11.3* 12.4*  HGB 11.9* 12.6 11.3* 11.2* 11.5*  HCT 35.1* 38.4 34.9* 34.5* 34.8*  MCV 97.8 99.0 100.3* 99.7 98.0  PLT 227 285 238 247 276   No results for input(s): CKTOTAL, CKMB, CKMBINDEX, TROPONINI in the last 168 hours. No results for input(s): LABPROT, INR in the last 72 hours. No results for input(s): COLORURINE, LABSPEC, PHURINE, GLUCOSEU, HGBUR, BILIRUBINUR, KETONESUR, PROTEINUR, UROBILINOGEN, NITRITE, LEUKOCYTESUR in the last 72 hours.  Invalid input(s): APPERANCEUR     Component Value Date/Time   CHOL 226* 11/26/2014 1200   TRIG 47 11/26/2014 1200   HDL 78  11/26/2014 1200   CHOLHDL 2.9 11/26/2014 1200   VLDL 9 11/26/2014 1200   LDLCALC 139* 11/26/2014 1200   Lab Results  Component Value Date   HGBA1C 5.7* 11/26/2014   No results found for: LABOPIA, COCAINSCRNUR, LABBENZ, AMPHETMU, THCU, LABBARB  No results for input(s): ETH in the last 168 hours.  I have personally reviewed the radiological images below and agree with the radiology interpretations.  Ct Head (brain) Wo Contrast 12/05/14 - 1. No significant interval change in size of evolving left thalamic hematoma with intraventricular extension. Overall, amount of intraventricular hemorrhage is decreased from previous with slightly decreased ventricular size. Right frontal approach ventriculostomy catheter in stable position. 2. Slightly increased prominence of scattered subarachnoid hemorrhage within the bilateral temporoparietal regions, favored to be related to redistribution. No definite new hemorrhage. 3. Similar size of right holohemispheric subdural hematoma measuring up to 9 mm. Associated right-to-left midline shift slightly improved at 2 mm (previously 4 mm).  12/03/14 - Evolving LEFT thalamic hematoma with intraventricular extension, similar intraventricular blood products, mild improved residual hydrocephalus with RIGHT frontal ventriculostomy catheter in place. Similar blood products along the catheter tract. Stable small RIGHT holohemispheric subdural hematoma measuring up to 9 mm. Similar 4 mm RIGHT to LEFT midline shift.  12/01/14 - When compared to the most recent examination, minimal decrease in amount of pneumocephalus and minimal increase in size of hematoma centered in the left thalamic region as detailed above. The amount of intraventricular blood, size of  the broad-based right subdural hematoma and degree of enlargement of the lateral ventricles is relatively similar to most recent exam.  11/30/14 - Interval placement of ventriculostomy catheter via new RIGHT  frontal burr hole with distal tip at the foramen of Monro, slightly decreased hydrocephalus. New 6 mm RIGHT holohemispheric acute subdural hematoma, a 4 mm RIGHT to LEFT midline shift (previously 4 mm LEFT to RIGHT midline shift). Evolving LEFT thalamic intraparenchymal hematoma. Increasing hemorrhagic intraventricular extension.  11/30/14 - Left thalamic hemorrhage and intraventricular hemorrhage unchanged in volume. Progressive ventricular enlargement compatible with obstructive hydrocephalus at the level of the aqueduct.  11/26/14 - Unchanged left thalamic hemorrhage with intraventricular extension.  11/26/2014   IMPRESSION: Acute 3.7 cm bleed in the left basal ganglia with decompression into the ventricles, moderate volume of intraventricular blood. There is 5 mm left-to-right midline shift.     Chest Port 1 View 11/30/14 - No acute chest findings. Mild basilar atelectasis.  11/26/2014    IMPRESSION: Prominence of the pulmonary interstitial markings and probable small left pleural effusion. Without previous studies it is not possible no with these findings are acute or old. A PA and lateral chest x-ray with deep inspiratory effort would be useful.    2D Echocardiogram  Left ventricle: The cavity size was normal. There was moderate concentric hypertrophy. Systolic function was normal. The estimated ejection fraction was in the range of 55% to 60%. Wall motion was normal; there were no regional wall motion abnormalities. Doppler parameters are consistent with abnormal left ventricular relaxation (grade 1 diastolic dysfunction). - Mitral valve: Mildly to moderately calcified annulus. - Left atrium: The atrium was moderately dilated. Impressions: - No cardiac source of emboli was indentified.  EKG  normal sinus rhythm. For complete results please see formal report.  EEG This EEG is consistent with a zone of potential epileptogenicity in the left parietal region. There was no  seizure recorded on this study.   PHYSICAL EXAM  Temp:  [98.2 F (36.8 C)-100.9 F (38.3 C)] 100.9 F (38.3 C) (05/30 1105) Pulse Rate:  [60-147] 95 (05/30 1400) Resp:  [24-44] 44 (05/30 1400) BP: (126-172)/(54-90) 163/83 mmHg (05/30 1407) SpO2:  [91 %-98 %] 92 % (05/30 1416)  General - Well nourished, well developed, not in any acute distress.  Ophthalmologic - not cooperative on exam.  Cardiovascular - Regular rate and rhythm with no murmur.  Neuro - awake alert today, eyes open spontaneously, but not following simple commands, Aphasic mute,nonverbal. PERRL, eyes in the midline position, but attending to left, not blinking to visual threat on the right, right facial droop. Right UE 0/5 and RLE no spontaneous movement but 2+/5 on pain stimulation. LUE and LLE spontaneous movement and localizing to pain. Right babinski positive. Reflex 1+. Sensation, coordination and gait not tested.  ASSESSMENT/PLAN Ms. SARAHELIZABETH CONWAY is a 69 y.o. female with history of HTN admitted for left BG hemorrhage with intraventricular extension. Symptoms stabilized and then transferred to floor. Found to have decreased mental status, repeat CT showed hydrocephalus. Transfer back to ICU for EVD placement.  Intraventricular hemorrhage s/p EVD  Currently drainage well from EVD  NSG Dr. Jordan Likes is on board  intraventricular TPA 12/04/14.  Repeat CT head showed significant improvement of intraventricular hemorrhage.  EVD draining well 8-15cc/hr ICP not monitor  CTH repeat on Tuesday    Left BG hemorrhage with intraventricular extension - likely due to hypertensive bleed  CT showed left BG hemorrhage with ventricular extension  Repeat CT showed stable hematoma  Repeat CT head 5/22 showed obstructive hydrocephalus  2D Echo  unremarkable  LDL 139, not at the goal  [id number] 5.7  Subcutaneous heparin  for VTE prophylaxis  no antithrombotic prior to admission, now on no antithrombotic  BP control  with goal < 160  Ongoing aggressive stroke risk factor management  Therapy recommendations:  pending  Disposition:  Pending  Obstructive hydrocephalus  Showing on the repeat CT 11/30/14   Likely to explain her lethargy and clinical decline 11/30/14  EVD placed  Currently drainage well  Repeat CT showed hydrocephalus improved  Respiratory distress  Stridor after extubation  More respiratory distress his morning  Put on NT-suctioning  Chest PT  Mucomyst nebulization  CXR stable  ? Seizure   Family report some eye deviation to right and left hand tremor  EEG left parietal sharps.  started on keppra  on keppra for seizure prevention, will continue   Hyponatremia  Na 131->132->133->135->136->134->139->136->135->137-->138  On oral salt tablet  continue oral salt 2g tid  Close monitoring  Hyperglycemia  SSI  Likely due to tube feeding  CBG monitoring  Added Lantus 10 units daily at bedtime  Hypertension  Home meds:   Lisinopril, metoprolol BP still high wason  cleviprex drip, weaned off  Continue lisinopril , amlodipine and hydralazine  Added clonidine 0.1 tid BP goal < 160  Hyperlipidemia  Home meds:  none   Currently on none  LDL 139, goal < 70  on lipitor 20  Continue statin at discharge  UTI - resolved  UA showed WBC too numerous to count  Put on cipro  urine cultures E Coli and sensitive to cipro  Repeat UA clear after antibiotics course  Other Stroke Risk Factors  Advanced age  ETOH use - 2-3 drinks daily  Other Active Problems  Leukocytosis - resolved  Other Pertinent History  Not check BP at home  Hospital day # 12  This patient is critically ill due to recurrent intraventricular hemorrhage, left basal ganglia hemorrhage with ventricular extension, strider post extubation and at significant risk of neurological worsening, death form recurrent hemorrhage, obstructive hydrocephalus, cerebral edema, brain  herniation, seizure, and cardiopulmonary failure. This patient's care requires constant monitoring of vital signs, hemodynamics, respiratory and cardiac monitoring, review of multiple databases, neurological assessment, discussion with family, other specialists and medical decision making of high complexity. I spent 32 minutes of neurocritical care time in the care of this patient. Plan change BP goal to 180.PT/OT consults.D/w family at bedside   SETHI,PRAMOD   12/08/2014 2:41 PM   To contact Stroke Continuity provider, please refer to [email or web address]. After hours, contact General Neurology

## 2014-12-09 ENCOUNTER — Inpatient Hospital Stay (HOSPITAL_COMMUNITY): Payer: Medicare HMO

## 2014-12-09 ENCOUNTER — Encounter (HOSPITAL_COMMUNITY): Payer: Self-pay

## 2014-12-09 DIAGNOSIS — Z515 Encounter for palliative care: Secondary | ICD-10-CM

## 2014-12-09 LAB — GLUCOSE, CAPILLARY
GLUCOSE-CAPILLARY: 117 mg/dL — AB (ref 65–99)
GLUCOSE-CAPILLARY: 148 mg/dL — AB (ref 65–99)
GLUCOSE-CAPILLARY: 154 mg/dL — AB (ref 65–99)
GLUCOSE-CAPILLARY: 186 mg/dL — AB (ref 65–99)
GLUCOSE-CAPILLARY: 190 mg/dL — AB (ref 65–99)
GLUCOSE-CAPILLARY: 192 mg/dL — AB (ref 65–99)
GLUCOSE-CAPILLARY: 200 mg/dL — AB (ref 65–99)
GLUCOSE-CAPILLARY: 219 mg/dL — AB (ref 65–99)
GLUCOSE-CAPILLARY: 228 mg/dL — AB (ref 65–99)
GLUCOSE-CAPILLARY: 258 mg/dL — AB (ref 65–99)
Glucose-Capillary: 201 mg/dL — ABNORMAL HIGH (ref 65–99)
Glucose-Capillary: 176 mg/dL — ABNORMAL HIGH (ref 65–99)
Glucose-Capillary: 183 mg/dL — ABNORMAL HIGH (ref 65–99)
Glucose-Capillary: 176 mg/dL — ABNORMAL HIGH (ref 65–99)
Glucose-Capillary: 161 mg/dL — ABNORMAL HIGH (ref 65–99)
Glucose-Capillary: 165 mg/dL — ABNORMAL HIGH (ref 65–99)
Glucose-Capillary: 158 mg/dL — ABNORMAL HIGH (ref 65–99)
Glucose-Capillary: 174 mg/dL — ABNORMAL HIGH (ref 65–99)
Glucose-Capillary: 172 mg/dL — ABNORMAL HIGH (ref 65–99)
Glucose-Capillary: 177 mg/dL — ABNORMAL HIGH (ref 65–99)
Glucose-Capillary: 173 mg/dL — ABNORMAL HIGH (ref 65–99)
Glucose-Capillary: 160 mg/dL — ABNORMAL HIGH (ref 65–99)
Glucose-Capillary: 169 mg/dL — ABNORMAL HIGH (ref 65–99)
Glucose-Capillary: 176 mg/dL — ABNORMAL HIGH (ref 65–99)

## 2014-12-09 LAB — CBC
HEMATOCRIT: 35.2 % — AB (ref 36.0–46.0)
HEMOGLOBIN: 11.6 g/dL — AB (ref 12.0–15.0)
MCH: 32.1 pg (ref 26.0–34.0)
MCHC: 33 g/dL (ref 30.0–36.0)
MCV: 97.5 fL (ref 78.0–100.0)
Platelets: 289 10*3/uL (ref 150–400)
RBC: 3.61 MIL/uL — ABNORMAL LOW (ref 3.87–5.11)
RDW: 12.1 % (ref 11.5–15.5)
WBC: 12.5 10*3/uL — ABNORMAL HIGH (ref 4.0–10.5)

## 2014-12-09 LAB — BASIC METABOLIC PANEL
Anion gap: 7 (ref 5–15)
BUN: 24 mg/dL — ABNORMAL HIGH (ref 6–20)
CO2: 27 mmol/L (ref 22–32)
Calcium: 8.6 mg/dL — ABNORMAL LOW (ref 8.9–10.3)
Chloride: 98 mmol/L — ABNORMAL LOW (ref 101–111)
Creatinine, Ser: 0.4 mg/dL — ABNORMAL LOW (ref 0.44–1.00)
GFR calc Af Amer: >60 mL/min (ref >60)
Glucose, Bld: 194 mg/dL — ABNORMAL HIGH (ref 65–99)
Potassium: 3.8 mmol/L (ref 3.5–5.1)
Sodium: 132 mmol/L — ABNORMAL LOW (ref 135–145)

## 2014-12-09 NOTE — Progress Notes (Signed)
RT in to assess pt for scheduled CPT via bed. Family at bedside request therapy to be held at this time. Palliative consult pending. BBS cta, decreased. VSS and NAD noted.

## 2014-12-09 NOTE — Progress Notes (Signed)
PULMONARY / CRITICAL CARE MEDICINE   Name: Kathryn Khan MRN: [id number] DOB: 05/09/1946    ADMISSION DATE:  11/26/2014 CONSULTATION DATE:  5/23  REFERRING MD :  Tora KindredXu/Stroke   INITIAL PRESENTATION:  [age] F adm to stroke service 5/18 with acute L basal ganglia hemorrhage and IVH. Had progressive hydrocephalus and underwent R frontal ventriculostomy 5/22. Catheter clotted requiring replacement. Progressive obtundation required ETI 5/22 PM. PCCm asked to assist with vent and med mgmt PCCM re-consulted for resp distress 5/30 .   MAJOR EVENTS/TEST RESULTS: 5/18 Admission with L basal ganglia hemorrhagic CVA with IVH. R hemiplegia and aphasia on exam 5/18 CT head: Acute 3.7 cm bleed in the left basal ganglia with decompression into the ventricles, moderate volume of intraventricular blood. There is 5 mm left-to-right midline shift 5/18 repeat CT head: Unchanged left thalamic hemorrhage with intraventricular extension. 5/19 More awake alert and can repeat with answering some questions appropriately but severe dysarthria 5/20 Overall stable 5/21 Increased BP. Metoprolol initiated 5/22 AM: Rapid response call for decreased LOC. Low grade fever. Pyuria. Ciprofloxacin initiated 5/22 Stat CT head: Left thalamic hemorrhage and intraventricular hemorrhage unchanged in volume. 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 R frontal ventriculostomy placed  5/22 Intubated for depressed LOC 5/22 ventric drain malfunction. IVC replaced [name](Cram) 5/22 repeat CT head: Stable size of the left thalamic hematoma. Stable vasogenic edema and stable mild midline shift. Stable obstructive hydrocephalus with blood in the cerebral aqueduct 5/23 CT head: minimal decrease in amount of pneumocephalus and minimal increase in size of hematoma centered in the left thalamic region as detailed above. The amount of intraventricular blood, size  of the broad-based right subdural hematoma and degree of enlargement of the lateral ventricles is relatively similar to most recent exam 5/24 LOC improved. Passed SBT. Developed stridor approx one hour after extubation. Received nebulized racemic epi X 2 and methylpred initiated. PRN NPPV 5/25 Evolving LEFT thalamic hematoma with intraventricular extension, similar intraventricular blood products, mild improved residual hydrocephalus with RIGHT frontal ventriculostomy catheter in place 5/26 Stridor resolved. intraventricular tPA administered 5/26 PCCM signed off  5/30 PCCM reconsulted for resp distress   INDWELLING DEVICES:: ETT 5/22 >> 5/24 R IJ CVL 5/22 >>   MICRO DATA: MRSA PCR 5/18 >> NEG Urine 5/19 >> E coli C diff PCR 5/20 >> NEG  ANTIMICROBIALS:  Cipro 5/19 >> 5/24  SUBJECTIVE:  5/30  pt with increased wob , tachypnea , hypoxia   VITAL SIGNS: Temp:  [98.4 F (36.9 C)-101.1 F (38.4 C)] 98.4 F (36.9 C) (05/31 0744) Pulse Rate:  [64-106] 78 (05/31 0800) Resp:  [23-44] 25 (05/31 0800) BP: (117-164)/(62-97) 148/72 mmHg (05/31 0800) SpO2:  [91 %-100 %] 96 % (05/31 0800) FiO2 (%):  [45 %-50 %] 45 % (05/31 0744) HEMODYNAMICS:   VENTILATOR SETTINGS: Vent Mode:  [-]  FiO2 (%):  [45 %-50 %] 45 % INTAKE / OUTPUT:  Intake/Output Summary (Last 24 hours) at 12/09/14 1003 Last data filed at 12/09/14 0900  Gross per 24 hour  Intake   2195 ml  Output   1999 ml  Net    196 ml    PHYSICAL EXAMINATION: General: chronically ill appearing , increased wob  Neuro:  EOMI, PERRL, R hemiplegia  HEENT: NCAT, ?stridor  Cardiovascular: Reg, no M Lungs: coarse rhonchi , +accessory use  Abdomen: Soft, NT, +BS Ext: warm, tr edema  LABS:  CBC  Recent Labs Lab 12/07/14 0440 12/08/14  0448 12/09/14 0443  WBC 11.3* 12.4* 12.5*  HGB 11.2* 11.5* 11.6*  HCT 34.5* 34.8* 35.2*  PLT 247 276 289   Coag's No results for input(s): APTT, INR in the last 168 hours. BMET  Recent  Labs Lab 12/07/14 0440 12/08/14 0448 12/09/14 0443  NA 136 134* 132*  K 3.7 3.6 3.8  CL 101 97* 98*  CO2 BUN 21* 19 24*  CREATININE 0.49 0.47 0.40*  GLUCOSE 194* 184* 194*   Electrolytes  Recent Labs Lab 12/07/14 0440 12/08/14 0448 12/09/14 0443  CALCIUM 8.6* 8.6* 8.6*   Sepsis Markers  Recent Labs Lab 12/08/14 1508  LATICACIDVEN 0.7   ABG No results for input(s): PHART, PCO2ART, PO2ART in the last 168 hours. Liver Enzymes No results for input(s): AST, ALT, ALKPHOS, BILITOT, ALBUMIN in the last 168 hours. Cardiac Enzymes No results for input(s): TROPONINI, PROBNP in the last 168 hours. Glucose  Recent Labs Lab 12/08/14 1103 12/08/14 1543 12/08/14 2005 12/08/14 2327 12/09/14 0310 12/09/14 0742  GLUCAP 173* 160* 169* 176* 258* 228*    CXR: pending     ASSESSMENT / PLAN:  PULMONARY A: VDRF due to AMS>resolved  Post extubation stridor - resolved 5/26  Acute Resp Distress ? Stridor component  ?Aspiration PNA /HCAP   P:   BIPAP support  Decadron  q6 x 4 dose  Check CXR  ABX see ID sxn  CARDIOVASCULAR A:  Hyperlipidemia - chronic statin  Htn - chronic beta blocker, ACEI Sinus bradycardia, resolved Intermittent Htn P:  SBP goal < 160 mmHg Cont clevidipine per stroke team Enteral antihypertensive meds per Stroke team  RENAL A:   Mild hypokalemia, >resolved  P:   Intermittent bmet   GASTROINTESTINAL A:  Dysphagia P:   SUP: enteral famotidine Hold TF for now   HEMATOLOGIC A:   Mild ICU acquired anemia without acute blood loss P:  DVT px: SCDs Intermittent cbc   INFECTIOUS A:   Uncomplicated UTI,  tx w/ aBX >resolved  Fever and elevated WBC   P:   CHeck cxr  Check lactate  BC x 2  Empiric ABX for aspiration PNA   ENDOCRINE A:   Hyperglycemia without prior dx of DM  Exacerbated by steroids P:   Cont sens scale SSI Goal glu < 180  NEUROLOGIC A:  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  Discussed code status, now DNR.  They do not wish for trach/peg.  They are concerned that the patient is suffering.  I am concerned about this as well.  They were very clear that patient would not want this level of care and would never want to be in a nursing home.  Confirmed DNR.  Will call palliative care to see patient.  Discussed with Dr. Pearlean Brownie and he is in agreement.  The patient is critically ill with multiple organ systems failure and requires high complexity decision making for assessment and support, frequent evaluation and titration of therapies, application of advanced monitoring technologies and extensive interpretation of multiple databases.   Critical Care Time devoted to patient care services described in this note is  35  Minutes. This time reflects time of care of this signee Dr Koren Bound. This critical care time does not reflect procedure time, or teaching time or supervisory time of PA/NP/Med student/Med  Resident etc but could involve care discussion time.  Alyson Reedy, M.D. [facility] Pulmonary/Critical Care Medicine. Pager: [phone]. After hours pager: [phone].  12/09/2014, 10:03 AM

## 2014-12-09 NOTE — Progress Notes (Signed)
Tech attempted to check patient's 4PM CBG. Patient's family at bedside and is refusing CBG checks at this time. Awaiting palliative consult.

## 2014-12-09 NOTE — Progress Notes (Signed)
STROKE TEAM PROGRESS NOTE   SUBJECTIVE (INTERVAL HISTORY)  family is not  at the bedside. Her EVD drainage continues to drain and 10-15cc/h. Had intraventricular TPA treatment 12/05/14. Repeat CT  12/09/14 shows much decreased intraventricular blood products and hydrocephalus. Patient neurologically stable, no significant change from yesterday.  BP better controlled now.  cleviprex weaned   Off    OBJECTIVE Temp:  [98.4 F (36.9 C)-101.1 F (38.4 C)] 98.4 F (36.9 C) (05/31 0744) Pulse Rate:  [64-106] 95 (05/31 1207) Cardiac Rhythm:  [-] Sinus tachycardia (05/31 0800) Resp:  [23-44] 36 (05/31 1207) BP: (117-163)/(62-97) 142/79 mmHg (05/31 1207) SpO2:  [91 %-100 %] 99 % (05/31 1207) FiO2 (%):  [45 %-50 %] 45 % (05/31 0744)   Recent Labs Lab 12/08/14 1543 12/08/14 2005 12/08/14 2327 12/09/14 0310 12/09/14 0742  GLUCAP 160* 169* 176* 258* 228*    Recent Labs Lab 12/05/14 0804 12/06/14 0535 12/07/14 0440 12/08/14 0448 12/09/14 0443  NA 137 138 136 134* 132*  K 3.6 3.8 3.7 3.6 3.8  CL 101 103 101 97* 98*  CO2 GLUCOSE 215* 203* 194* 184* 194*  BUN 25* 28* 21* 19 24*  CREATININE 0.52 0.49 0.49 0.47 0.40*  CALCIUM 9.1 8.7* 8.6* 8.6* 8.6*   No results for input(s): AST, ALT, ALKPHOS, BILITOT, PROT, ALBUMIN in the last 168 hours.  Recent Labs Lab 12/05/14 0804 12/06/14 0535 12/07/14 0440 12/08/14 0448 12/09/14 0443  WBC 10.8* 8.9 11.3* 12.4* 12.5*  HGB 12.6 11.3* 11.2* 11.5* 11.6*  HCT 38.4 34.9* 34.5* 34.8* 35.2*  MCV 99.0 100.3* 99.7 98.0 97.5  PLT 285 238 247 276 289   No results for input(s): CKTOTAL, CKMB, CKMBINDEX, TROPONINI in the last 168 hours. No results for input(s): LABPROT, INR in the last 72 hours. No results for input(s): COLORURINE, LABSPEC, PHURINE, GLUCOSEU, HGBUR, BILIRUBINUR, KETONESUR, PROTEINUR, UROBILINOGEN, NITRITE, LEUKOCYTESUR in the last 72 hours.  Invalid input(s): APPERANCEUR     Component Value Date/Time   CHOL  226* 11/26/2014 1200   TRIG 47 11/26/2014 1200   HDL 78 11/26/2014 1200   CHOLHDL 2.9 11/26/2014 1200   VLDL 9 11/26/2014 1200   LDLCALC 139* 11/26/2014 1200   Lab Results  Component Value Date   HGBA1C 5.7* 11/26/2014   No results found for: LABOPIA, COCAINSCRNUR, LABBENZ, AMPHETMU, THCU, LABBARB  No results for input(s): ETH in the last 168 hours.  I have personally reviewed the radiological images below and agree with the radiology interpretations.  Ct Head (brain) Wo Contrast 12/09/2014 :  Left thalamic 3 cm hematoma has demonstrated expected evolution with surrounding vasogenic edema. Right frontal shunt catheter tip within the frontal horn of the right lateral ventricle. Decrease in amount of intraventricular blood. Decrease size of lateral ventricles. Broad-based right-sided subdural complex hematoma measuring up to 9.1 mm without significant change. Mild mass effect upon the right lateral ventricle. No significant midline shift. Small amount of subarachnoid blood most notable left parietal -occipital region without significant change. 12/05/14 - 1. No significant interval change in size of evolving left thalamic hematoma with intraventricular extension. Overall, amount of intraventricular hemorrhage is decreased from previous with slightly decreased ventricular size. Right frontal approach ventriculostomy catheter in stable position. 2. Slightly increased prominence of scattered subarachnoid hemorrhage within the bilateral temporoparietal regions, favored to be related to redistribution. No definite new hemorrhage. 3. Similar size of right holohemispheric subdural hematoma measuring up to 9 mm. Associated right-to-left midline shift slightly improved at 2  mm (previously 4 mm).  12/03/14 - Evolving LEFT thalamic hematoma with intraventricular extension, similar intraventricular blood products, mild improved residual hydrocephalus with RIGHT frontal ventriculostomy  catheter in place. Similar blood products along the catheter tract. Stable small RIGHT holohemispheric subdural hematoma measuring up to 9 mm. Similar 4 mm RIGHT to LEFT midline shift.  12/01/14 - When compared to the most recent examination, minimal decrease in amount of pneumocephalus and minimal increase in size of hematoma centered in the left thalamic region as detailed above. The amount of intraventricular blood, size of the broad-based right subdural hematoma and degree of enlargement of the lateral ventricles is relatively similar to most recent exam.  11/30/14 - Interval placement of ventriculostomy catheter via new RIGHT frontal burr hole with distal tip at the foramen of Monro, slightly decreased hydrocephalus. New 6 mm RIGHT holohemispheric acute subdural hematoma, a 4 mm RIGHT to LEFT midline shift (previously 4 mm LEFT to RIGHT midline shift). Evolving LEFT thalamic intraparenchymal hematoma. Increasing hemorrhagic intraventricular extension.  11/30/14 - Left thalamic hemorrhage and intraventricular hemorrhage unchanged in volume. Progressive ventricular enlargement compatible with obstructive hydrocephalus at the level of the aqueduct.  11/26/14 - Unchanged left thalamic hemorrhage with intraventricular extension.  11/26/2014   IMPRESSION: Acute 3.7 cm bleed in the left basal ganglia with decompression into the ventricles, moderate volume of intraventricular blood. There is 5 mm left-to-right midline shift.     Chest Port 1 View 11/30/14 - No acute chest findings. Mild basilar atelectasis.  11/26/2014    IMPRESSION: Prominence of the pulmonary interstitial markings and probable small left pleural effusion. Without previous studies it is not possible no with these findings are acute or old. A PA and lateral chest x-ray with deep inspiratory effort would be useful.    2D Echocardiogram  Left ventricle: The cavity size was normal. There was moderate concentric hypertrophy.  Systolic function was normal. The estimated ejection fraction was in the range of 55% to 60%. Wall motion was normal; there were no regional wall motion abnormalities. Doppler parameters are consistent with abnormal left ventricular relaxation (grade 1 diastolic dysfunction). - Mitral valve: Mildly to moderately calcified annulus. - Left atrium: The atrium was moderately dilated. Impressions: - No cardiac source of emboli was indentified.  EKG  normal sinus rhythm. For complete results please see formal report.  EEG This EEG is consistent with a zone of potential epileptogenicity in the left parietal region. There was no seizure recorded on this study.   PHYSICAL EXAM  Temp:  [98.4 F (36.9 C)-101.1 F (38.4 C)] 98.4 F (36.9 C) (05/31 0744) Pulse Rate:  [64-106] 95 (05/31 1207) Resp:  [23-44] 36 (05/31 1207) BP: (117-163)/(62-97) 142/79 mmHg (05/31 1207) SpO2:  [91 %-100 %] 99 % (05/31 1207) FiO2 (%):  [45 %-50 %] 45 % (05/31 0744)  General - Well nourished, well developed, not in any acute distress.  Ophthalmologic - not cooperative on exam.  Cardiovascular - Regular rate and rhythm with no murmur.  Neuro - drowsy but arousable today, eyes open spontaneously, but not following simple commands, Aphasic mute,nonverbal. PERRL, eyes in the midline position, but attending to left, not blinking to visual threat on the right, right facial droop. Right UE 0/5 and RLE no spontaneous movement but 2+/5 on pain stimulation. LUE and LLE spontaneous movement and localizing to pain. Right babinski positive. Reflex 1+. Sensation, coordination and gait not tested.  ASSESSMENT/PLAN [name] is a 69 y.o. female with history of HTN admitted for left  BG hemorrhage with intraventricular extension. Symptoms stabilized and then transferred to floor. Found to have decreased mental status, repeat CT showed hydrocephalus. Transfer back to ICU for EVD placement.  Intraventricular  hemorrhage s/p EVD  Currently drainage well from EVD  NSG Dr. Jordan Likes is on board  intraventricular TPA 12/04/14.  Repeat CT head showed significant improvement of intraventricular hemorrhage.  EVD draining well 8-15cc/hr ICP not monitor  CTH repeat on Tuesday    Left BG hemorrhage with intraventricular extension - likely due to hypertensive bleed  CT showed left BG hemorrhage with ventricular extension  Repeat CT showed stable hematoma  Repeat CT head 5/22 showed obstructive hydrocephalus  2D Echo  unremarkable  LDL 139, not at the goal  [id number] 5.7  Subcutaneous heparin  for VTE prophylaxis  no antithrombotic prior to admission, now on no antithrombotic  BP control with goal < 160  Ongoing aggressive stroke risk factor management  Therapy recommendations:  pending  Disposition:  Pending  Obstructive hydrocephalus  Showing on the repeat CT 11/30/14   Likely to explain her lethargy and clinical decline 11/30/14  EVD placed  Currently drainage well  Repeat CT showed hydrocephalus improved  Respiratory distress  Stridor after extubation  More respiratory distress his morning  Put on NT-suctioning  Chest PT  Mucomyst nebulization  CXR stable  ? Seizure   Family report some eye deviation to right and left hand tremor  EEG left parietal sharps.  started on keppra  on keppra for seizure prevention, will continue   Hyponatremia  Na 131->132->133->135->136->134->139->136->135->137-->138  On oral salt tablet  continue oral salt 2g tid  Close monitoring  Hyperglycemia  SSI  Likely due to tube feeding  CBG monitoring  Added Lantus 10 units daily at bedtime  Hypertension  Home meds:   Lisinopril, metoprolol BP still high wason  cleviprex drip, weaned off  Continue lisinopril , amlodipine and hydralazine  Added clonidine 0.1 tid BP goal < 160  Hyperlipidemia  Home meds:  none   Currently on none  LDL 139, goal < 70  on  lipitor 20  Continue statin at discharge  UTI - resolved  UA showed WBC too numerous to count  Put on cipro  urine cultures E Coli and sensitive to cipro  Repeat UA clear after antibiotics course  Other Stroke Risk Factors  Advanced age  ETOH use - 2-3 drinks daily  Other Active Problems  Leukocytosis - resolved  Other Pertinent History  Not check BP at home  Hospital day # 13  This patient is critically ill due to recurrent intraventricular hemorrhage, left basal ganglia hemorrhage with ventricular extension, strider post extubation and at significant risk of neurological worsening, death form recurrent hemorrhage, obstructive hydrocephalus, cerebral edema, brain herniation, seizure, and cardiopulmonary failure. This patient's care requires constant monitoring of vital signs, hemodynamics, respiratory and cardiac monitoring, review of multiple databases, neurological assessment, discussion with family, other specialists and medical decision making of high complexity. I spent 30 minutes of neurocritical care time in the care of this patient. Plan  to.D/w family goals of care and trach/peg needs   SETHI,PRAMOD   12/09/2014 1:22 PM   To contact Stroke Continuity provider, please refer to [email or web address]. After hours, contact General Neurology

## 2014-12-09 NOTE — Care Management Note (Signed)
Case Management Note  Patient Details  Name: Conard NovakChantal A Nickles MRN: [id number] Date of Birth: 01/11/1946  Subjective/Objective:   UR completed.  Pt with minimal neurological improvements; continues to have difficulty with respiratory status and aspiration.  Family in discussions with physicians regarding continuation of aggressive treatment efforts.                 Action/Plan: Palliative consult pending for goals of care.  Await recommendations for family's wishes regarding pt's treatment.  Will follow.    Expected Discharge Date:                  Expected Discharge Plan:  Skilled Nursing Facility  In-House Referral:  Clinical Social Work  Discharge planning Services  CM Consult  Post Acute Care Choice:    Choice offered to:     DME Arranged:    DME Agency:     HH Arranged:    HH Agency:     Status of Service:  In process, will continue to follow  Medicare Important Message Given:    Date Medicare IM Given:    Medicare IM give by:    Date Additional Medicare IM Given:    Additional Medicare Important Message give by:     If discussed at Long Length of Stay Meetings, dates discussed:  5/24, 5/26, 5/31  Additional Comments:  Quintella BatonJulie W. Jacere Pangborn, RN, BSN  Trauma/Neuro ICU Case Manager [phone]

## 2014-12-09 NOTE — Progress Notes (Addendum)
Nutrition Follow-up   INTERVENTION:  If pt re-starts TF recommend:  Initiate Jevity 1.2 @ 50 ml/hr via NG tube   30 ml Prostat daily.   Tube feeding regimen provides 1540 kcal (100% of needs), 81 grams of protein, and 972 ml of H2O.   Recommend new weight  NUTRITION DIAGNOSIS:  Inadequate oral intake related to inability to eat as evidenced by NPO status.  ongoing  GOAL:  Patient will meet greater than or equal to 90% of their needs  Not met  MONITOR:  TF tolerance, Labs, Weight trends  REASON FOR ASSESSMENT:  Consult Enteral/tube feeding initiation and management  ASSESSMENT:  79 F adm to stroke service 5/18 with acute L basal ganglia hemorrhage and IVH. Had progressive hydrocephalus and underwent R frontal ventriculostomy 5/22. Catheter clotted requiring replacement. Pt with decreased LOC and intubated 5/22  Pt extubated 5/24, unable to pass swallow eval, NG placed 5/25 (tip in distal stomach).   Pt began to some respiratory distress and seen by CCM, plan for bipap, TF held 5/31, palliative care consult pending. Per notes family does not want trach/PEG.   Labs reviewed: Sodium low, CBG's: 169-258 Medications reviewed and include: decadron, lantus, moderate sliding scale Pt discussed during ICU rounds and with RN.    Height:  Ht Readings from Last 1 Encounters:  11/30/14 5' 4" (1.626 m)    Weight:  Wt Readings from Last 1 Encounters:  11/26/14 160 lb 4.4 oz (72.7 kg)  No new weight  Ideal Body Weight:  54.4 kg  Wt Readings from Last 10 Encounters:  11/26/14 160 lb 4.4 oz (72.7 kg)    BMI:  Body mass index is 27.5 kg/(m^2).  Estimated Nutritional Needs:  Kcal:  1500-1700  Protein:  75-90  Fluid:  > 1.5 L/day  Skin:  Reviewed, no issues  Diet Order:    NPO  EDUCATION NEEDS:  No education needs identified at this time   Intake/Output Summary (Last 24 hours) at 12/09/14 1145 Last data filed at 12/09/14 1000  Gross per 24 hour   Intake   2270 ml  Output   1914 ml  Net    356 ml    Last BM:  5/28  Maylon Peppers RD, [place], [place] Pager [phone] After Hours Pager

## 2014-12-09 NOTE — Progress Notes (Signed)
Patient with increasing difficulty with respiratory status and aspiration. Neurologically she remains awake minimally aware. No verbalization.  Continues to drain approximately 10-15 mL of blood-tinged CSF per hour. Follow-up head CT scan demonstrates no new hemorrhage. Ventricles well decompressed.  Family is in the process of determining limits of care and are likely to choose to proceed with palliative care. If this is the case then I would remove the ventriculostomy set the patient could be transferred to the palatal care unit.

## 2014-12-09 NOTE — Progress Notes (Signed)
PT Cancellation Note  Patient Details Name: Kathryn NovakChantal A Oconnell MRN: [id number] DOB: 02/13/1946   Cancelled Treatment:    Reason Eval/Treat Not Completed: Medical issues which prohibited therapy.  Pt  Looking worse from a respiratory standpoint, not ready for activity. 12/09/2014  [place] BingKen Bevelyn Arriola, PT [phone]  (pager)   Jilliana Burkes, Eliseo GumKenneth V 12/09/2014, 10:17 AM

## 2014-12-09 NOTE — Progress Notes (Signed)
SLP Cancellation Note  Patient Details Name: Kathryn Khan MRN: [id number] DOB: 02/26/1946   Cancelled treatment:       Reason Eval/Treat Not Completed: Fatigue/lethargy limiting ability to participate.  Continues to have difficulty with respiratory status.  Palliative care consult pending.  Not appropriate for SLP intervention today.  Will follow.    Blenda MountsCouture, Baili Stang Laurice 12/09/2014, 2:47 PM

## 2014-12-10 ENCOUNTER — Inpatient Hospital Stay (HOSPITAL_COMMUNITY): Payer: Medicare HMO

## 2014-12-10 DIAGNOSIS — R0689 Other abnormalities of breathing: Secondary | ICD-10-CM

## 2014-12-10 DIAGNOSIS — R0600 Dyspnea, unspecified: Secondary | ICD-10-CM

## 2014-12-10 DIAGNOSIS — Z515 Encounter for palliative care: Secondary | ICD-10-CM | POA: Insufficient documentation

## 2014-12-10 LAB — CBC WITH DIFFERENTIAL/PLATELET
BASOS ABS: 0 10*3/uL (ref 0.0–0.1)
BASOS PCT: 0 % (ref 0–1)
EOS ABS: 0 10*3/uL (ref 0.0–0.7)
EOS PCT: 0 % (ref 0–5)
HCT: 33.6 % — ABNORMAL LOW (ref 36.0–46.0)
Hemoglobin: 11.5 g/dL — ABNORMAL LOW (ref 12.0–15.0)
LYMPHS PCT: 6 % — AB (ref 12–46)
Lymphs Abs: 0.8 10*3/uL (ref 0.7–4.0)
MCH: 32.6 pg (ref 26.0–34.0)
MCHC: 34.2 g/dL (ref 30.0–36.0)
MCV: 95.2 fL (ref 78.0–100.0)
MONO ABS: 1.5 10*3/uL — AB (ref 0.1–1.0)
MONOS PCT: 12 % (ref 3–12)
Neutro Abs: 11 10*3/uL — ABNORMAL HIGH (ref 1.7–7.7)
Neutrophils Relative %: 82 % — ABNORMAL HIGH (ref 43–77)
Platelets: 304 10*3/uL (ref 150–400)
RBC: 3.53 MIL/uL — ABNORMAL LOW (ref 3.87–5.11)
RDW: 11.8 % (ref 11.5–15.5)
WBC: 13.4 10*3/uL — ABNORMAL HIGH (ref 4.0–10.5)

## 2014-12-10 MED ORDER — LORAZEPAM 2 MG/ML IJ SOLN
0.5000 mg | INTRAMUSCULAR | Status: DC | PRN
Start: 2014-12-10 — End: 2014-12-11
  Administered 2014-12-11: 0.5 mg via INTRAVENOUS
  Administered 2014-12-11: 1 mg via INTRAVENOUS
  Administered 2014-12-11: 0.5 mg via INTRAVENOUS
  Filled 2014-12-10 (×3): qty 1

## 2014-12-10 MED ORDER — HALOPERIDOL LACTATE 5 MG/ML IJ SOLN: 2.0000 mg | Freq: Four times a day (QID) | INTRAMUSCULAR | Status: DC | PRN

## 2014-12-10 MED ORDER — HYDRALAZINE HCL 20 MG/ML IJ SOLN: 10.0000 mg | INTRAMUSCULAR | Status: DC | PRN

## 2014-12-10 MED ORDER — DOCUSATE SODIUM 50 MG/5ML PO LIQD
100.0000 mg | Freq: Two times a day (BID) | ORAL | Status: DC
  Administered 2014-12-10: 100 mg
  Filled 2014-12-10 (×3): qty 10

## 2014-12-10 MED ORDER — LORAZEPAM 2 MG/ML IJ SOLN: 0.5000 mg | INTRAMUSCULAR | Status: DC | PRN

## 2014-12-10 MED ORDER — ATROPINE SULFATE 1 % OP SOLN
4.0000 [drp] | OPHTHALMIC | Status: DC | PRN
  Filled 2014-12-10: qty 2

## 2014-12-10 MED ORDER — HYDROMORPHONE HCL 1 MG/ML IJ SOLN
0.5000 mg | INTRAMUSCULAR | Status: DC | PRN
  Administered 2014-12-10 (×2): 1 mg via INTRAVENOUS
  Filled 2014-12-10 (×2): qty 1

## 2014-12-10 MED ORDER — METOPROLOL TARTRATE 1 MG/ML IV SOLN: 5.0000 mg | Freq: Four times a day (QID) | INTRAVENOUS | Status: DC

## 2014-12-10 MED ORDER — BISACODYL 10 MG RE SUPP
10.0000 mg | Freq: Every day | RECTAL | Status: DC | PRN
  Administered 2014-12-10: 10 mg via RECTAL
  Filled 2014-12-10: qty 1

## 2014-12-10 MED ORDER — HYDRALAZINE HCL 20 MG/ML IJ SOLN
10.0000 mg | Freq: Four times a day (QID) | INTRAMUSCULAR | Status: DC
  Administered 2014-12-10: 10 mg via INTRAVENOUS
  Filled 2014-12-10: qty 1

## 2014-12-10 MED ORDER — HALOPERIDOL LACTATE 5 MG/ML IJ SOLN: 2.0000 mg | INTRAMUSCULAR | Status: DC | PRN

## 2014-12-10 MED ORDER — ATROPINE SULFATE 1 % OP SOLN
4.0000 [drp] | OPHTHALMIC | Status: DC | PRN
  Administered 2014-12-11: 4 [drp] via SUBLINGUAL
  Filled 2014-12-10: qty 5

## 2014-12-10 MED ORDER — HYDROMORPHONE HCL 1 MG/ML IJ SOLN
0.5000 mg | INTRAMUSCULAR | Status: DC | PRN
Start: 2014-12-10 — End: 2014-12-11
  Administered 2014-12-10 – 2014-12-11 (×8): 1 mg via INTRAVENOUS
  Filled 2014-12-10 (×8): qty 1

## 2014-12-10 NOTE — Progress Notes (Signed)
Nutrition Brief Note  Chart reviewed. Pt now transitioning to comfort care.  No further nutrition interventions warranted at this time.  Please re-consult as needed.   Reshma Hoey A. Diane Mochizuki, RD, LDN, CDE Pager: [phone] After hours Pager: [phone]  

## 2014-12-10 NOTE — Progress Notes (Signed)
Ventriculostomy tube removed upon family's request. Transitioning to palliative care. Please call if I can be of any assistance.

## 2014-12-10 NOTE — Progress Notes (Signed)
PT DISCHARGE Note  Patient Details Name: Kathryn Khan MRN: [id number] DOB: 06/27/1946   Cancelled Treatment:    Reason Eval/Treat Not Completed: Other (comment) (pt is now transitioning to full comfort care, will sign off.) 12/10/2014  [place] BingKen Khaden Khan, PT [phone]  (pager)  Kathryn Khan, Eliseo GumKenneth Khan 12/10/2014, 11:22 AM

## 2014-12-10 NOTE — Progress Notes (Signed)
STROKE TEAM PROGRESS NOTE   SUBJECTIVE (INTERVAL HISTORY)  family is not  at the bedside.  Family wants comfort care only.  OBJECTIVE Temp:  [97.3 F (36.3 C)-98.4 F (36.9 C)] 98.4 F (36.9 C) (06/01 0732) Pulse Rate:  [32-97] 97 (06/01 0900) Cardiac Rhythm:  [-] Sinus tachycardia (05/31 2000) Resp:  [21-32] 24 (06/01 0900) BP: (111-159)/(56-96) 148/77 mmHg (06/01 0900) SpO2:  [95 %-100 %] 100 % (06/01 0900)   Recent Labs Lab 12/08/14 2005 12/08/14 2327 12/09/14 0310 12/09/14 0742 12/09/14 1144  GLUCAP 169* 176* 258* 228* 219*    Recent Labs Lab 12/05/14 0804 12/06/14 0535 12/07/14 0440 12/08/14 0448 12/09/14 0443  NA 137 138 136 134* 132*  K 3.6 3.8 3.7 3.6 3.8  CL 101 103 101 97* 98*  CO2 GLUCOSE 215* 203* 194* 184* 194*  BUN 25* 28* 21* 19 24*  CREATININE 0.52 0.49 0.49 0.47 0.40*  CALCIUM 9.1 8.7* 8.6* 8.6* 8.6*   No results for input(s): AST, ALT, ALKPHOS, BILITOT, PROT, ALBUMIN in the last 168 hours.  Recent Labs Lab 12/06/14 0535 12/07/14 0440 12/08/14 0448 12/09/14 0443 12/10/14 0500  WBC 8.9 11.3* 12.4* 12.5* 13.4*  NEUTROABS  --   --   --   --  11.0*  HGB 11.3* 11.2* 11.5* 11.6* 11.5*  HCT 34.9* 34.5* 34.8* 35.2* 33.6*  MCV 100.3* 99.7 98.0 97.5 95.2  PLT 238 247 276 289 304   No results for input(s): CKTOTAL, CKMB, CKMBINDEX, TROPONINI in the last 168 hours. No results for input(s): LABPROT, INR in the last 72 hours. No results for input(s): COLORURINE, LABSPEC, PHURINE, GLUCOSEU, HGBUR, BILIRUBINUR, KETONESUR, PROTEINUR, UROBILINOGEN, NITRITE, LEUKOCYTESUR in the last 72 hours.  Invalid input(s): APPERANCEUR     Component Value Date/Time   CHOL 226* 11/26/2014 1200   TRIG 47 11/26/2014 1200   HDL 78 11/26/2014 1200   CHOLHDL 2.9 11/26/2014 1200   VLDL 9 11/26/2014 1200   LDLCALC 139* 11/26/2014 1200   Lab Results  Component Value Date   HGBA1C 5.7* 11/26/2014   No results found for: LABOPIA, COCAINSCRNUR,  LABBENZ, AMPHETMU, THCU, LABBARB  No results for input(s): ETH in the last 168 hours.  I have personally reviewed the radiological images below and agree with the radiology interpretations.  Ct Head (brain) Wo Contrast 12/09/2014 :  Left thalamic 3 cm hematoma has demonstrated expected evolution with surrounding vasogenic edema. Right frontal shunt catheter tip within the frontal horn of the right lateral ventricle. Decrease in amount of intraventricular blood. Decrease size of lateral ventricles. Broad-based right-sided subdural complex hematoma measuring up to 9.1 mm without significant change. Mild mass effect upon the right lateral ventricle. No significant midline shift. Small amount of subarachnoid blood most notable left parietal -occipital region without significant change. 12/05/14 - 1. No significant interval change in size of evolving left thalamic hematoma with intraventricular extension. Overall, amount of intraventricular hemorrhage is decreased from previous with slightly decreased ventricular size. Right frontal approach ventriculostomy catheter in stable position. 2. Slightly increased prominence of scattered subarachnoid hemorrhage within the bilateral temporoparietal regions, favored to be related to redistribution. No definite new hemorrhage. 3. Similar size of right holohemispheric subdural hematoma measuring up to 9 mm. Associated right-to-left midline shift slightly improved at 2 mm (previously 4 mm).  12/03/14 - Evolving LEFT thalamic hematoma with intraventricular extension, similar intraventricular blood products, mild improved residual hydrocephalus with RIGHT frontal ventriculostomy catheter in place. Similar blood products along the catheter  tract. Stable small RIGHT holohemispheric subdural hematoma measuring up to 9 mm. Similar 4 mm RIGHT to LEFT midline shift.  12/01/14 - When compared to the most recent examination, minimal decrease in amount of  pneumocephalus and minimal increase in size of hematoma centered in the left thalamic region as detailed above. The amount of intraventricular blood, size of the broad-based right subdural hematoma and degree of enlargement of the lateral ventricles is relatively similar to most recent exam.  11/30/14 - Interval placement of ventriculostomy catheter via new RIGHT frontal burr hole with distal tip at the foramen of Monro, slightly decreased hydrocephalus. New 6 mm RIGHT holohemispheric acute subdural hematoma, a 4 mm RIGHT to LEFT midline shift (previously 4 mm LEFT to RIGHT midline shift). Evolving LEFT thalamic intraparenchymal hematoma. Increasing hemorrhagic intraventricular extension.  11/30/14 - Left thalamic hemorrhage and intraventricular hemorrhage unchanged in volume. Progressive ventricular enlargement compatible with obstructive hydrocephalus at the level of the aqueduct.  11/26/14 - Unchanged left thalamic hemorrhage with intraventricular extension.  11/26/2014   IMPRESSION: Acute 3.7 cm bleed in the left basal ganglia with decompression into the ventricles, moderate volume of intraventricular blood. There is 5 mm left-to-right midline shift.     Chest Port 1 View 11/30/14 - No acute chest findings. Mild basilar atelectasis.  11/26/2014    IMPRESSION: Prominence of the pulmonary interstitial markings and probable small left pleural effusion. Without previous studies it is not possible no with these findings are acute or old. A PA and lateral chest x-ray with deep inspiratory effort would be useful.    2D Echocardiogram  Left ventricle: The cavity size was normal. There was moderate concentric hypertrophy. Systolic function was normal. The estimated ejection fraction was in the range of 55% to 60%. Wall motion was normal; there were no regional wall motion abnormalities. Doppler parameters are consistent with abnormal left ventricular relaxation (grade 1 diastolic  dysfunction). - Mitral valve: Mildly to moderately calcified annulus. - Left atrium: The atrium was moderately dilated. Impressions: - No cardiac source of emboli was indentified.  EKG  normal sinus rhythm. For complete results please see formal report.  EEG This EEG is consistent with a zone of potential epileptogenicity in the left parietal region. There was no seizure recorded on this study.   PHYSICAL EXAM  Temp:  [97.3 F (36.3 C)-98.4 F (36.9 C)] 98.4 F (36.9 C) (06/01 0732) Pulse Rate:  [32-97] 97 (06/01 0900) Resp:  [21-32] 24 (06/01 0900) BP: (111-159)/(56-96) 148/77 mmHg (06/01 0900) SpO2:  [95 %-100 %] 100 % (06/01 0900)  General - Well nourished, well developed, not in any acute distress.  Ophthalmologic - not cooperative on exam.  Cardiovascular - Regular rate and rhythm with no murmur.  Neuro - awake today, eyes open spontaneously, but not following simple commands, Aphasic mute,nonverbal. PERRL, eyes in the midline position, but attending to left, not blinking to visual threat on the right, right facial droop. Right UE 0/5 and RLE no spontaneous movement but 2+/5 on pain stimulation. LUE and LLE spontaneous movement and localizing to pain. Right babinski positive. Reflex 1+. Sensation, coordination and gait not tested.  ASSESSMENT/PLAN Kathryn Khan is a 69 y.o. female with history of HTN admitted for left BG hemorrhage with intraventricular extension. Symptoms stabilized and then transferred to floor. Found to have decreased mental status, repeat CT showed hydrocephalus. Transfer back to ICU for EVD placement.  Intraventricular hemorrhage s/p EVD  Currently drainage well from EVD  NSG Dr. Jordan LikesPool is on board  intraventricular TPA 12/04/14.  Repeat CT head showed significant improvement of intraventricular hemorrhage.  EVD draining well 8-15cc/hr ICP not monitor  CTH repeat on Tuesday    Left BG hemorrhage with intraventricular extension - likely due  to hypertensive bleed  CT showed left BG hemorrhage with ventricular extension  Repeat CT showed stable hematoma  Repeat CT head 5/22 showed obstructive hydrocephalus  2D Echo  unremarkable  LDL 139, not at the goal  [id number] 5.7  Subcutaneous heparin  for VTE prophylaxis  no antithrombotic prior to admission, now on no antithrombotic  BP control with goal < 160  Ongoing aggressive stroke risk factor management  Therapy recommendations:  pending  Disposition:  Pending  Obstructive hydrocephalus  Showing on the repeat CT 11/30/14   Likely to explain her lethargy and clinical decline 11/30/14  EVD placed  Currently drainage well  Repeat CT showed hydrocephalus improved  Respiratory distress  Stridor after extubation  More respiratory distress his morning  Put on NT-suctioning  Chest PT  Mucomyst nebulization  CXR stable  ? Seizure   Family report some eye deviation to right and left hand tremor  EEG left parietal sharps.  started on keppra  on keppra for seizure prevention, will continue   Hyponatremia  Na 131->132->133->135->136->134->139->136->135->137-->138  On oral salt tablet  continue oral salt 2g tid  Close monitoring  Hyperglycemia  SSI  Likely due to tube feeding  CBG monitoring  Added Lantus 10 units daily at bedtime  Hypertension  Home meds:   Lisinopril, metoprolol BP still high wason  cleviprex drip, weaned off  Continue lisinopril , amlodipine and hydralazine  Added clonidine 0.1 tid BP goal < 160  Hyperlipidemia  Home meds:  none   Currently on none  LDL 139, goal < 70  on lipitor 20  Continue statin at discharge  UTI - resolved  UA showed WBC too numerous to count  Put on cipro  urine cultures E Coli and sensitive to cipro  Repeat UA clear after antibiotics course  Other Stroke Risk Factors  Advanced age  ETOH use - 2-3 drinks daily  Other Active Problems  Leukocytosis - resolved  Other  Pertinent History  Not check BP at home  Hospital day # 14  This patient is critically ill due to recurrent intraventricular hemorrhage, left basal ganglia hemorrhage with ventricular extension, strider post extubation and at significant risk of neurological worsening, death form recurrent hemorrhage, obstructive hydrocephalus, cerebral edema, brain herniation, seizure, and cardiopulmonary failure and family are agreeable to comfort care and hence we will discontinue ICU level care, start morphine drip, discontinue ventriculostomy and transferred to the floor.  SETHI,PRAMOD   12/10/2014  1:34 PM   To contact Stroke Continuity provider, please refer to [email or web address]. After hours, contact General Neurology

## 2014-12-10 NOTE — Progress Notes (Signed)
PULMONARY / CRITICAL CARE MEDICINE   Name: Kathryn Khan MRN: [id number] DOB: 09/13/1945    ADMISSION DATE:  11/26/2014 CONSULTATION DATE:  5/23  REFERRING MD :  Tora KindredXu/Stroke   INITIAL PRESENTATION:  [age] F adm to stroke service 5/18 with acute L basal ganglia hemorrhage and IVH. Had progressive hydrocephalus and underwent R frontal ventriculostomy 5/22. Catheter clotted requiring replacement. Progressive obtundation required ETI 5/22 PM. PCCm asked to assist with vent and med mgmt PCCM re-consulted for resp distress 5/30 .   MAJOR EVENTS/TEST RESULTS: 5/18 Admission with L basal ganglia hemorrhagic CVA with IVH. R hemiplegia and aphasia on exam 5/18 CT head: Acute 3.7 cm bleed in the left basal ganglia with decompression into the ventricles, moderate volume of intraventricular blood. There is 5 mm left-to-right midline shift 5/18 repeat CT head: Unchanged left thalamic hemorrhage with intraventricular extension. 5/19 More awake alert and can repeat with answering some questions appropriately but severe dysarthria 5/20 Overall stable 5/21 Increased BP. Metoprolol initiated 5/22 AM: Rapid response call for decreased LOC. Low grade fever. Pyuria. Ciprofloxacin initiated 5/22 Stat CT head: Left thalamic hemorrhage and intraventricular hemorrhage unchanged in volume. 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 R frontal ventriculostomy placed  5/22 Intubated for depressed LOC 5/22 ventric drain malfunction. IVC replaced [name](Cram) 5/22 repeat CT head: Stable size of the left thalamic hematoma. Stable vasogenic edema and stable mild midline shift. Stable obstructive hydrocephalus with blood in the cerebral aqueduct 5/23 CT head: minimal decrease in amount of pneumocephalus and minimal increase in size of hematoma centered in the left thalamic region as detailed above. The amount of intraventricular blood, size  of the broad-based right subdural hematoma and degree of enlargement of the lateral ventricles is relatively similar to most recent exam 5/24 LOC improved. Passed SBT. Developed stridor approx one hour after extubation. Received nebulized racemic epi X 2 and methylpred initiated. PRN NPPV 5/25 Evolving LEFT thalamic hematoma with intraventricular extension, similar intraventricular blood products, mild improved residual hydrocephalus with RIGHT frontal ventriculostomy catheter in place 5/26 Stridor resolved. intraventricular tPA administered 5/26 PCCM signed off  5/30 PCCM reconsulted for resp distress   INDWELLING DEVICES:: ETT 5/22 >> 5/24 R IJ CVL 5/22 >>   MICRO DATA: MRSA PCR 5/18 >> NEG Urine 5/19 >> E coli C diff PCR 5/20 >> NEG  ANTIMICROBIALS:  Cipro 5/19 >> 5/24  SUBJECTIVE:  5/30  pt with increased wob , tachypnea , hypoxia   VITAL SIGNS: Temp:  [97.3 F (36.3 C)-98.4 F (36.9 C)] 98.4 F (36.9 C) (06/01 0732) Pulse Rate:  [32-129] 87 (06/01 0800) Resp:  [21-36] 24 (06/01 0800) BP: (111-159)/(56-96) 134/81 mmHg (06/01 0800) SpO2:  [95 %-100 %] 100 % (06/01 0800) FiO2 (%):  [45 %] 45 % (05/31 1200) HEMODYNAMICS:   VENTILATOR SETTINGS: Vent Mode:  [-]  FiO2 (%):  [45 %] 45 % INTAKE / OUTPUT:  Intake/Output Summary (Last 24 hours) at 12/10/14 1042 Last data filed at 12/10/14 0953  Gross per 24 hour  Intake   1950 ml  Output   2564 ml  Net   -614 ml    PHYSICAL EXAMINATION: General: chronically ill appearing , increased wob  Neuro:  EOMI, PERRL, R hemiplegia  HEENT: NCAT, ?stridor  Cardiovascular: Reg, no M Lungs: coarse rhonchi , +accessory use  Abdomen: Soft, NT, +BS Ext: warm, tr edema  LABS:  CBC  Recent Labs Lab 12/08/14 0448 12/09/14 0443 12/10/14 0500  WBC 12.4* 12.5* 13.4*  HGB 11.5* 11.6* 11.5*  HCT 34.8* 35.2* 33.6*  PLT 276 289 304   Coag's No results for input(s): APTT, INR in the last 168 hours. BMET  Recent Labs Lab  12/07/14 0440 12/08/14 0448 12/09/14 0443  NA 136 134* 132*  K 3.7 3.6 3.8  CL 101 97* 98*  CO2 BUN 21* 19 24*  CREATININE 0.49 0.47 0.40*  GLUCOSE 194* 184* 194*   Electrolytes  Recent Labs Lab 12/07/14 0440 12/08/14 0448 12/09/14 0443  CALCIUM 8.6* 8.6* 8.6*   Sepsis Markers  Recent Labs Lab 12/08/14 1508  LATICACIDVEN 0.7   ABG No results for input(s): PHART, PCO2ART, PO2ART in the last 168 hours. Liver Enzymes No results for input(s): AST, ALT, ALKPHOS, BILITOT, ALBUMIN in the last 168 hours. Cardiac Enzymes No results for input(s): TROPONINI, PROBNP in the last 168 hours. Glucose  Recent Labs Lab 12/08/14 1543 12/08/14 2005 12/08/14 2327 12/09/14 0310 12/09/14 0742 12/09/14 1144  GLUCAP 160* 169* 176* 258* 228* 219*   CXR: Noted  ASSESSMENT / PLAN:  PULMONARY A: VDRF due to AMS>resolved  Post extubation stridor - resolved 5/26  Acute Resp Distress ? Stridor component  ?Aspiration PNA /HCAP   P:   D/C BiPAP. Decadron  q6 x 4 dose complete, will d/c. D/C CXR. D/C abx.  CARDIOVASCULAR A:  Hyperlipidemia - chronic statin  Htn - chronic beta blocker, ACEI Sinus bradycardia, resolved Intermittent Htn P:  SBP goal < 160 mmHg. D/C clevidipine. Hold PO anti-HTN.  RENAL A:   Mild hypokalemia, >resolved  P:   D/C further blood draws.  GASTROINTESTINAL A:  Dysphagia P:   SUP: enteral famotidine. D/C TF and transition for comfort care.  HEMATOLOGIC A:   Mild ICU acquired anemia without acute blood loss P:  DVT px: SCDs Intermittent cbc   INFECTIOUS A:   Uncomplicated UTI,  tx w/ aBX >resolved  Fever and elevated WBC   P:   D/C abx.  ENDOCRINE A:   Hyperglycemia without prior dx of DM  Exacerbated by steroids P:   D/C CBGs and ISS.  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 Transition to full comfort care.  FAMILY  - Updates:  Spoke with two daughters extensively bedside, transition to full comfort at this point, d/c BiPAP and d/c medication, recommend moving to a palliative care floor after NS removes IVC drain.  PCCM will sign off, please call back if needed.  The patient is critically ill with multiple organ systems failure and requires high complexity decision making for assessment and support, frequent evaluation and titration of therapies, application of advanced monitoring technologies and extensive interpretation of multiple databases.   Critical Care Time devoted to patient care services described in this note is  35  Minutes. This time reflects time of care of this signee Dr Koren Bound. This critical care time does not reflect procedure time, or teaching time or supervisory time of PA/NP/Med student/Med Resident etc but could involve care discussion time.  Alyson Reedy, M.D. [facility] Pulmonary/Critical Care Medicine. Pager: [phone]. After hours pager: [phone].  12/10/2014, 10:42 AM

## 2014-12-10 NOTE — Progress Notes (Signed)
Rehab admissions - Following from a distance.  Noted Palliative care consult pending.  Call me for questions.  #[phone]

## 2014-12-10 NOTE — Consult Note (Signed)
Consultation Note Date: 12/10/2014   Patient Name: Kathryn Khan  DOB: 08-Feb-1946  MRN: [id number]  Age / Sex: 69 y.o., female   PCP: Marden Noble, MD Referring Physician: Garvin Fila, MD  Reason for Consultation: Establishing goals of care and Terminal care  Palliative Care Assessment and Plan Summary of Established Goals of Care and Medical Treatment Preferences    Palliative Care Discussion Held Today Contacts/Participants in Discussion: Primary Decision Maker: Large family meeting with 5 children acting together in decision making  HCPOA: none documented   Met today with 5 children, multiple grandchildren, etc.  Appreciate bedside nursing attending as well. Reviewed what they have discussed with multiple other providers. At this point, they feel like the outcome (death) is inevitable and they want to do everything they can to ensure she is comfortable. They do not want to pursue any measures/medicines that will end up prolonging her life and ultimately suffering.  They wish to discontinue all unnecessary meds, remove feeding tubes, and treat comfort not numbers.  I will simplify medication list while we await ventricular drain removal by neurosurgery.  D/c most meds, but will slowly discontinue BP meds as we do not want to accelerate hypertensive emergencey with her elevated ICP.  As she becomes nearer to death we can discontinue all these as well. We talked about chances of passing away over next hours to days and will see how she does with removal of life sustaining treatments.  If it looks like her prognosis is days or more we talked about potential for transfer to hospice facility.   All family in agreement.  They are not waiting on other family members. They hope drain can be removed today so as not to prolong her dying process. I would recommend transfer to [facility] tower floor after ventricular drain removal.   Code Status/Advance Care Planning:  DNR  Symptom Management:    Pain/Dyspnea- will avoid morphine with codeine allergy as they metabolize in similar pathways. Dilaudid PRN.  If using frequently can consider continuous infusion.  Agitation/Anxiety- Remove tubes, restraints. Ativan/haldol PRN  Psycho-social/Spiritual:   Support System: Large family. She is originally from [place], [place] where her parents still live.  Very independent person prior to this event.   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  Her hospital stay has been complicated including increasing altered mental status on 5/22 thought secondary to obstructive hydrocephalus.  A ventriculostomy drain was subsequently placed and she required intubation with mechanical ventilation. She was able to be extubated on 5/24, but on 5/25 she was noted to have evolving thalamic hematoma and subsequently increased respiratory distress with concern for aspiration event. She used intermittent bipap. Given severity of her neurologic injury, family has been discussing goals of care with Pulm, Neurosurgery, and Neurology.  They have decided to pursue comfort measures and do everything to pursue dignity/comfort at the end of life.    Primary Diagnoses  Present on Admission:  . ICH (intracerebral hemorrhage)   I have reviewed the medical record, interviewed the patient and family, and examined the patient. The following aspects are pertinent.  Past Medical History  Diagnosis Date  . Hypertension    History   Social History  .  Marital Status: Married    Spouse Name: N/A  . Number of Children: N/A  . Years of Education: N/A   Social History Main Topics  . Smoking status: Former Smoker    Quit date: 11/25/1996  . Smokeless  tobacco: Never Used  . Alcohol Use: No  . Drug Use: No  . Sexual Activity: Not on file   Other Topics Concern  . None   Social History Narrative   No family history on file. Scheduled Meds: . antiseptic oral rinse  7 mL Mouth Rinse q12n4p  . hydrALAZINE  10 mg Intravenous Q6H  . levETIRAcetam  500 mg Intravenous Q12H  . metoprolol  5 mg Intravenous 4 times per day   Continuous Infusions:  PRN Meds:.atropine, haloperidol lactate, hydrALAZINE, HYDROmorphone (DILAUDID) injection, labetalol, LORazepam, ondansetron (ZOFRAN) IV Medications Prior to Admission:  Prior to Admission medications   Medication Sig Start Date End Date Taking? Authorizing Provider  atorvastatin (LIPITOR) 10 MG tablet Take 10 mg by mouth daily.   Yes Historical Provider, MD  lisinopril (PRINIVIL,ZESTRIL) 20 MG tablet Take 20 mg by mouth daily.   Yes Historical Provider, MD  metoprolol (LOPRESSOR) 50 MG tablet Take 50 mg by mouth 2 (two) times daily.   Yes Historical Provider, MD   Allergies  Allergen Reactions  . Penicillins Anaphylaxis  . Codeine Nausea And Vomiting  . Sulfa Antibiotics    CBC:    Component Value Date/Time   WBC 13.4* 12/10/2014 0500   HGB 11.5* 12/10/2014 0500   HCT 33.6* 12/10/2014 0500   PLT 304 12/10/2014 0500   MCV 95.2 12/10/2014 0500   NEUTROABS 11.0* 12/10/2014 0500   LYMPHSABS 0.8 12/10/2014 0500   MONOABS 1.5* 12/10/2014 0500   EOSABS 0.0 12/10/2014 0500   BASOSABS 0.0 12/10/2014 0500   Comprehensive Metabolic Panel:    Component Value Date/Time   NA 132* 12/09/2014 0443   K 3.8 12/09/2014 0443   CL 98* 12/09/2014 0443   CO2 27 12/09/2014 0443   BUN 24* 12/09/2014 0443   CREATININE 0.40* 12/09/2014 0443   GLUCOSE 194* 12/09/2014 0443   CALCIUM 8.6* 12/09/2014 0443   AST 22 11/30/2014 1520   ALT 23 11/30/2014 1520   ALKPHOS 56 11/30/2014 1520   BILITOT 1.2 11/30/2014 1520   PROT 7.0 11/30/2014 1520   ALBUMIN 3.8 11/30/2014 1520    Physical Exam: Vital  Signs: BP 134/81 mmHg  Pulse 87  Temp(Src) 98.4 F (36.9 C) (Oral)  Resp 24  Ht '5\' 4"'  (1.626 m)  Wt 72.7 kg (160 lb 4.4 oz)  BMI 27.50 kg/m2  SpO2 100% SpO2: SpO2: 100 % O2 Device: O2 Device: Nasal Cannula O2 Flow Rate: O2 Flow Rate (L/min): 6 L/min Intake/output summary:  Intake/Output Summary (Last 24 hours) at 12/10/14 0937 Last data filed at 12/10/14 0800  Gross per 24 hour  Intake   2030 ml  Output   2473 ml  Net   -443 ml   LBM:   Baseline Weight: Weight: 72.7 kg (160 lb 4.4 oz) Most recent weight: Weight: 72.7 kg (160 lb 4.4 oz)  Exam Findings:  GEN: alert, nonverbal HEENT: ventricular drain, NG, mmm CV: regular rate LUNHS: CTAB ABD: soft Neuro: unable to follow commands         Palliative Performance Scale: 20              Additional Data Reviewed: Recent Labs     12/08/14  0448  12/09/14  0443  12/10/14  0500  WBC  12.4*  12.5*  13.4*  HGB  11.5*  11.6*  11.5*  PLT  276  289  304  NA  134*  132*   --   BUN  19  24*   --   CREATININE  0.47  0.40*   --    5/27 CT Head IMPRESSION: 1. No significant interval change in size of evolving left thalamic hematoma with intraventricular extension. Overall, amount of intraventricular hemorrhage is decreased from previous with slightly decreased ventricular size. Right frontal approach ventriculostomy catheter in stable position. 2. Slightly increased prominence of scattered subarachnoid hemorrhage within the bilateral temporoparietal regions, favored to be related to redistribution. No definite new hemorrhage. 3. Similar size of right holohemispheric subdural hematoma measuring up to 9 mm. Associated right-to-left midline shift slightly improved at 2 mm (previously 4 mm).   6/1 CXR IMPRESSION: Tube and catheter positions as described without pneumothorax. Small pleural effusions, slightly larger on the left than on the right. Left base atelectasis. No change in cardiac silhouette.    Time Total: 55  minutes  Greater than 50%  of this time was spent counseling and coordinating care related to the above assessment and plan.  Signed by: Isaac Laud, DO  Doran Clay, DO  12/10/2014, 9:37 AM  Please contact Palliative Medicine Team phone at [phone] for questions and concerns.

## 2014-12-10 NOTE — Progress Notes (Signed)
Pt transferred to [facility] today after removal of IVC drain.  Family remains at bedside.  CSW consulted should residential hospice be needed.  Case management will follow progress.    Quintella BatonJulie W. Majesti Gambrell, RN, BSN  Trauma/Neuro ICU Case Manager [phone]

## 2014-12-10 NOTE — Progress Notes (Signed)
eLink Physician-Brief Progress Note Patient Name: Conard NovakChantal A Colon DOB: 06/07/1946 MRN: [id number]   Date of Service  12/10/2014  HPI/Events of Note  Constipation.  eICU Interventions  Will order: 1. Colace 100 mg via tube BID. 2. Dulcolax Suppository PRN.     Intervention Category Minor Interventions: Routine modifications to care plan (e.g. PRN medications for pain, fever)  Cloyde Oregel Eugene 12/10/2014, 12:30 AM

## 2014-12-10 NOTE — Progress Notes (Signed)
Chaplain responded to spiritual care consult for end of life. Chaplain spoke with pt daughters who have requested priest. Lunette StandsChaplain will get in touch with priest for the sacrament. Chaplain will continue to follow.   12/10/14 1000  Clinical Encounter Type  Visited With Patient and family together  Visit Type Follow-up;Spiritual support  Referral From Physician  Consult/Referral To Faith community  Spiritual Encounters  Spiritual Needs Emotional;Grief support  Stress Factors  Family Stress Factors Loss  Len Azeez, Mayer MaskerCourtney F, Chaplain 12/10/2014 10:16 AM

## 2014-12-11 MED ORDER — SODIUM CHLORIDE 0.9 % IJ SOLN: 10.0000 mL | Freq: Two times a day (BID) | INTRAMUSCULAR | Status: DC

## 2014-12-11 MED ORDER — SODIUM CHLORIDE 0.9 % IJ SOLN: 10.0000 mL | INTRAMUSCULAR | Status: DC | PRN

## 2014-12-11 MED ORDER — DIPHENHYDRAMINE HCL 50 MG/ML IJ SOLN
12.5000 mg | Freq: Three times a day (TID) | INTRAMUSCULAR | Status: DC | PRN
  Administered 2014-12-11: 12.5 mg via INTRAVENOUS
  Filled 2014-12-11: qty 1

## 2014-12-11 MED ORDER — HEPARIN SOD (PORK) LOCK FLUSH 10 UNIT/ML IV SOLN: 10.0000 [IU] | INTRAVENOUS | Status: DC | PRN

## 2014-12-11 NOTE — Discharge Summary (Addendum)
Physician Discharge Summary  Patient ID: Kathryn Khan MRN: [id number] DOB/AGE: Aug 01, 1945 69 y.o.  Admit date: 11/26/2014 Discharge date: 12/11/2014  Admission Diagnoses: New onset aphasia and right hemiplegia.  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 Patient made DO NOT RESUSCITATE and comfort care and being transferred to hospice skilled nursing facility Active Problems:   ICH (intracerebral hemorrhage)   IVH (intraventricular hemorrhage)   Acute respiratory failure, unspecified whether with hypoxia or hypercapnia   Encounter for feeding tube placement   Intraventricular hemorrhage   Stridor   Stroke   Accelerated hypertension   Obstructive hydrocephalus   HLD (hyperlipidemia)   Cytotoxic cerebral edema   Dyspnea and respiratory abnormality   Palliative care encounter   Discharged Condition: comfort care only}  Hospital Course:  Kathryn Khan is an 69 y.o. female with a history of hypertension, brought to the emergency room and code stroke status after being found on the floor by her husband at 4 AM this morning. She was unable to speak and was not moving her right side. She was last seen well at 9 PM asked night. She was noted to be hypertensive with blood pressure of 200/117. CT scan of her head showed an acute 3.7 cm hemorrhage involving left basal ganglia with extension into the left lateral ventricle. There was a 5 mm left-to-right midline shift. Patient was nauseated and was given Zofran milligrams IV, in addition to 20 mg of labetalol for management of hypertensive urgency. NIH stroke score was 24. Patient was admitted to neuro intensive care unit. LSN: 9 PM on 11/25/2014 tPA Given: No: Acute ICH/IVH Patient was intubated and ventilatory management was by pulmonary critical care. Patient remained aphasic with dense right hemiplegia and  was not following commands. The ventricular ostomy catheter was draining consistently a 10-15 mL an hour the patient was arousable and awake but remained aphasic and did not follow any commands. She continued to have dense right hemiplegia and follow-up repeat CT scans showed stable appearance of the hematoma without increasing hydrocephalus. Since the patient's condition did not clinically improve it was necessary to consider PEG tube but the family did not want artificial nutrition and realized the patient prognosis was not great and they felt patient would not have wanted to live a life for major disability requiring feeding tube and hence they made the patient DO NOT RESUSCITATE and comfort care. Ventriculostomy catheter was removed on 12/10/14 the patient's showed gradual neurological decline. She was kept comfortable with morphine drip as well as when necessary Ativan and Benadryl for itching. Family was comfortable with her decision and requested transfer to a nursing facility close to home for ongoing comfort care.  Consults:Neurosurgery Dr Jordan Likes  PCCM Significant Diagnostic Studies:  Ct Head (brain) Wo Contrast 12/09/2014 :  Left thalamic 3 cm hematoma has demonstrated expected evolution with surrounding vasogenic edema. Right frontal shunt catheter tip within the frontal horn of the right lateral ventricle. Decrease in amount of intraventricular blood. Decrease size of lateral ventricles. Broad-based right-sided subdural complex hematoma measuring up to 9.1 mm without significant change. Mild mass effect upon the right lateral ventricle. No significant midline shift. Small amount of subarachnoid blood most notable left parietal -occipital region without significant change. 12/05/14 - 1. No significant interval change in size of evolving left thalamic hematoma with intraventricular extension. Overall, amount of intraventricular hemorrhage is decreased from previous with slightly decreased  ventricular size. Right  frontal approach ventriculostomy catheter in stable position. 2. Slightly increased prominence of scattered subarachnoid hemorrhage within the bilateral temporoparietal regions, favored to be related to redistribution. No definite new hemorrhage. 3. Similar size of right holohemispheric subdural hematoma measuring up to 9 mm. Associated right-to-left midline shift slightly improved at 2 mm (previously 4 mm).  12/03/14 - Evolving LEFT thalamic hematoma with intraventricular extension, similar intraventricular blood products, mild improved residual hydrocephalus with RIGHT frontal ventriculostomy catheter in place. Similar blood products along the catheter tract. Stable small RIGHT holohemispheric subdural hematoma measuring up to 9 mm. Similar 4 mm RIGHT to LEFT midline shift.  12/01/14 - When compared to the most recent examination, minimal decrease in amount of pneumocephalus and minimal increase in size of hematoma centered in the left thalamic region as detailed above. The amount of intraventricular blood, size of the broad-based right subdural hematoma and degree of enlargement of the lateral ventricles is relatively similar to most recent exam.  11/30/14 - Interval placement of ventriculostomy catheter via new RIGHT frontal burr hole with distal tip at the foramen of Monro, slightly decreased hydrocephalus. New 6 mm RIGHT holohemispheric acute subdural hematoma, a 4 mm RIGHT to LEFT midline shift (previously 4 mm LEFT to RIGHT midline shift). Evolving LEFT thalamic intraparenchymal hematoma. Increasing hemorrhagic intraventricular extension.  11/30/14 - Left thalamic hemorrhage and intraventricular hemorrhage unchanged in volume. Progressive ventricular enlargement compatible with obstructive hydrocephalus at the level of the aqueduct.  11/26/14 - Unchanged left thalamic hemorrhage with intraventricular extension.  11/26/2014 IMPRESSION: Acute 3.7 cm bleed  in the left basal ganglia with decompression into the ventricles, moderate volume of intraventricular blood. There is 5 mm left-to-right midline shift.   Chest Port 1 View 11/30/14 - No acute chest findings. Mild basilar atelectasis.  11/26/2014 IMPRESSION: Prominence of the pulmonary interstitial markings and probable small left pleural effusion. Without previous studies it is not possible no with these findings are acute or old. A PA and lateral chest x-ray with deep inspiratory effort would be useful.   2D Echocardiogram Left ventricle: The cavity size was normal. There was moderate concentric hypertrophy. Systolic function was normal. The estimated ejection fraction was in the range of 55% to 60%. Wall motion was normal; there were no regional wall motion abnormalities. Doppler parameters are consistent with abnormal left ventricular relaxation (grade 1 diastolic dysfunction). - Mitral valve: Mildly to moderately calcified annulus. - Left atrium: The atrium was moderately dilated. Impressions: - No cardiac source of emboli was indentified.  EKG normal sinus rhythm. For complete results please see formal report.  EEG This EEG is consistent with a zone of potential epileptogenicity in the left parietal region. There was no seizure recorded on this study.   Treatments: IV hydration, antibiotics: , analgesia: , respiratory therapy: , therapies:  and procedures: ventriculostomy, intubation  Discharge Exam: Blood pressure 150/86, pulse 89, temperature 98.7 F (37.1 C), temperature source Oral, resp. rate 20, height  (1.626 m), weight 160 lb 4.4 oz (72.7 kg), SpO2 94 %. Neuro - drowsy but arousable today, eyes open spontaneously, but not following simple commands, Aphasic mute,nonverbal. PERRL, eyes in the midline position, but attending to left, not blinking to visual threat on the right, right facial droop. Right UE 0/5 and RLE no spontaneous movement but 2+/5 on pain  stimulation. LUE and LLE spontaneous movement and localizing to pain. Right babinski positive. Reflex 1+. Sensation, coordination and gait not tested.  Disposition: SNF for comfort care only     Medication List  ASK your doctor about these medications         Atropine 0.1% opthalmic solution 4 drops sublingually q 2 hrly prn for secretions Iv dilaudin 0.5-1 ml prn every 15 mins for pain Iv morphine 1 cc prn every 15 mins for comfort Iv benadryl 12,5 mh prn every 6 hrly for itching Iv ativan 0.5-1 mg every 2 hrly prn for agitation               Follow-up Information    Go to to follow up.   Why:  hospice SNF     Time spent on DC summary 35 minutes Signed: Khilee Hendricksen 12/11/2014, 11:50 AM

## 2014-12-11 NOTE — Progress Notes (Signed)
Matisse A Riddle to be D/C'd Skilled nursing facility: [facility] per MD order.  Discussed with the patient/family and all questions fully answered.  VSS, Skin clean, dry and intact without evidence of skin break down, no evidence of skin tears noted. Central line flushed and capped by IV team. Foley catheter emptied piror to transport.  SNF packet prepared by Child psychotherapistsocial worker and sent with EMS. Report called to Mindy at [facility].  Burt EkCook, Wendall Isabell D 12/11/2014 2:13 PM

## 2014-12-11 NOTE — Care Management Note (Signed)
Case Management Note  Patient Details  Name: Conard NovakChantal A Lipa MRN: [id number] Date of Birth: 07/30/1945  Subjective/Objective:                    Action/Plan:   Expected Discharge Date:       12-11-14           Expected Discharge Plan:  Hospice Medical Facility  In-House Referral:  Clinical Social Work  Discharge planning Services  CM Consult  Post Acute Care Choice:    Choice offered to:     DME Arranged:    DME Agency:     HH Arranged:    HH Agency:     Status of Service:  Completed, signed off  Medicare Important Message Given:  Yes Date Medicare IM Given:  12/11/14 Medicare IM give by:  Ronny FlurryHeather Philomene Haff RN BSN  Date Additional Medicare IM Given:    Additional Medicare Important Message give by:     If discussed at Long Length of Stay Meetings, dates discussed:    Additional Comments:  Kingsley PlanWile, Charizma Gardiner Marie, RN 12/11/2014, 2:13 PM

## 2014-12-11 NOTE — Clinical Social Work Note (Signed)
Clinical Social Work Assessment  Patient Details  Name: Conard NovakChantal A Misenheimer MRN: [id number] Date of Birth: 12/20/1945  Date of referral:  12/11/14               Reason for consult:  End of Life/Hospice                Permission sought to share information with:  Family Supports Permission granted to share information::  Yes, Verbal Permission Granted  Name::     Patient's family members  Agency::  [facility]  Relationship::     Contact Information:     Housing/Transportation Living arrangements for the past 2 months:  Single Family Home Source of Information:  Spouse, Adult Children Patient Interpreter Needed:  None Criminal Activity/Legal Involvement Pertinent to Current Situation/Hospitalization:    Significant Relationships:  Adult Children, Spouse, Other Family Members Lives with:  Spouse Do you feel safe going back to the place where you live?  No Need for family participation in patient care:  Yes (Comment) (Patient is not able to discuss due to the stroke she has had.  Patient's husband and family gave information.)  Care giving concerns:  Patient's health has deteriorated significantly and now patient has transitioned to comfort care.  Patient's family have agreed to have her go to residential hospice facility.   Social Worker assessment / plan:  CSW was unable to assess patient due to stroke, information was gathered from family members and her husband.  Patient is a 69 year old female who was living with her husband.  Patient has suffered a second stroke, which has caused her to transition to comfort care due to a poor prognosis by physician.  Patient's family is at bedside and are very supportive of family decision to pursue comfort care and hospice placement.  Patient's family stated that she was doing well until about a week ago when she had her second stroke.  Patient's family have agreed to have [facility].     Employment status:  Retired Database administratornsurance information:  Managed Medicare PT Recommendations:  Not assessed at this time Information / Referral to community resources:     Patient/Family's Response to care: Patient's family agreeable to hospice placement.  Patient/Family's Understanding of and Emotional Response to Diagnosis, Current Treatment, and Prognosis:  Patient and family are all agreeable to hospice placement in [place] since patient is from there and it is closer to other family members.  Emotional Assessment Appearance:  Appears older than stated age Attitude/Demeanor/Rapport:  Unable to Assess Affect (typically observed):  Unable to Assess Orientation:  Fluctuating Orientation (Suspected and/or reported Sundowners) Alcohol / Substance use:  Not Applicable Psych involvement (Current and /or in the community):  No (Comment)  Discharge Needs  Concerns to be addressed:  No discharge needs identified Readmission within the last 30 days:  No Current discharge risk:  None Barriers to Discharge:  No Barriers Identified   Darleene Cleavernterhaus, Rayyan Orsborn R, LCSWA 12/11/2014, 1:23 PM

## 2014-12-11 NOTE — Clinical Social Work Note (Signed)
CSW met with the family to discuss Hospice house placement.  The patient's family stated they have spoken to palliative and would like to go to [facility] in [place] if the physician agrees that patient is stable to transport and she is medically ready to discharge.  CSW contacted [facility], who said they have a bed available for patient to discharge today.  CSW informed physician that bed is available at [facility].  Jones Broom. Emile Ringgenberg, MSW, [place] 12/11/2014 11:18 AM

## 2014-12-11 NOTE — Clinical Social Work Note (Signed)
Patient to be d/c'ed today to [facility].  Patient and family agreeable to plans will transport via ems RN to call report.  Family very appreciative of all the care that has been provided by medical team.  Windell MouldingEric Rodneisha Bonnet, MSW, Theresia MajorsLCSWA [phone]

## 2014-12-15 LAB — CULTURE, BLOOD (ROUTINE X 2)
Culture: NO GROWTH
Culture: NO GROWTH

## 2015-01-09 DEATH — deceased

## 2015-03-03 ENCOUNTER — Telehealth: Payer: Self-pay | Admitting: Neurology

## 2015-03-03 NOTE — Telephone Encounter (Signed)
  If the daughter in law calls back, let her know its a 25.00 to have the form filled out by the md  Pts daughter in law will be faxing form. Will get approval from Dr Roda Shutters on filling the form out.

## 2015-03-03 NOTE — Telephone Encounter (Signed)
Daughter in law Rise Patience called [phone]. She has a form that needs to be filled out in order to be reimbursed for airline ticket for trip that she was unable to go on due to mother in law having a stroke. Mother in law was admitted on 11/26/14 and Airline Departure date 12/03/14-Return Arrival 12-09-2014. Mother in law deceased on Dec 14, 2014. She will fax the form to [place].

## 2015-03-05 NOTE — Telephone Encounter (Signed)
Rn left message for [employer]. This is the decease patients daughter in law who is requesting a form to be filled out for reimbursement. The form has to be filled out. Kathryn Khan needs to pay the 25.00 fee to have the form release.

## 2015-03-06 DIAGNOSIS — Z0289 Encounter for other administrative examinations: Secondary | ICD-10-CM

## 2015-03-06 NOTE — Telephone Encounter (Signed)
Kathryn Khan in accounting stated Kathryn Khan paided the fee for the release. Kathryn Khan is the daughter in law of the decease patient and needs a refund for her plane ticket. Form was fax to [place] at [phone].

## 2015-11-16 IMAGING — CT CT HEAD W/O CM
1 series · 15 of 30 positions shown, 19 images · non-contrast
Comparison: 12/05/2014.

CLINICAL DATA: 69-year-old hypertensive female with intracranial
hemorrhage. Follow-up. Subsequent encounter.

EXAM:
CT HEAD WITHOUT CONTRAST
TECHNIQUE: Contiguous axial images were obtained from the base of the skull
through the vertex without intravenous contrast.

[Series 2: head 5.0 h30s · axial · 0.44mm/px · z∈[-121,+14]mm · 15 of 30 slices shown, 19 images]
[im 2/30  brain]
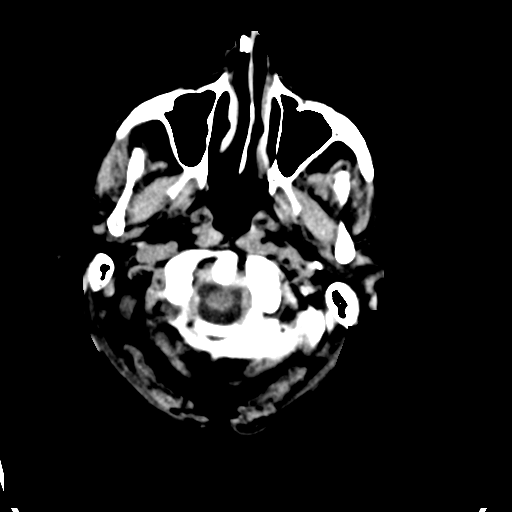
[im 2/30  bone]
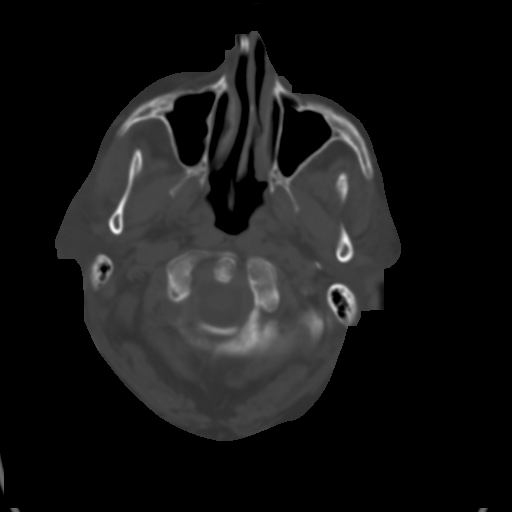
[im 4/30  brain]
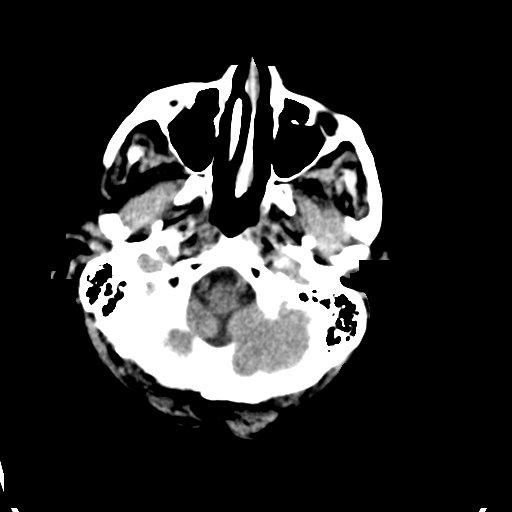
[im 6/30  brain]
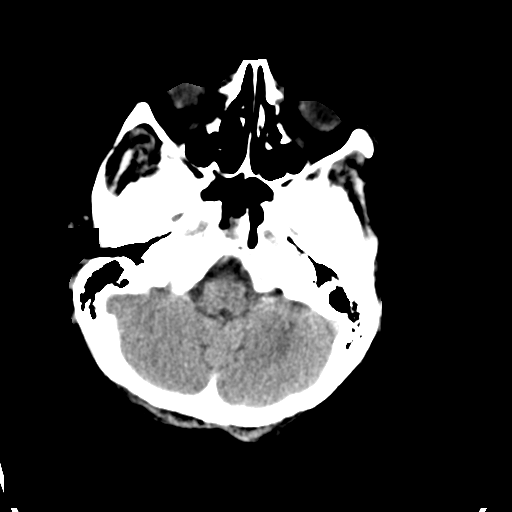
[im 8/30  brain]
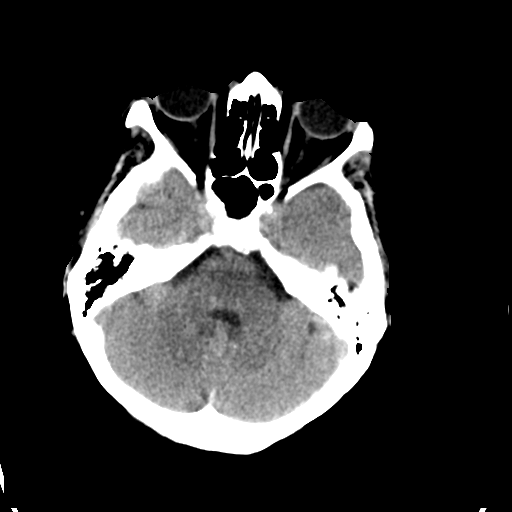
[im 10/30  brain]
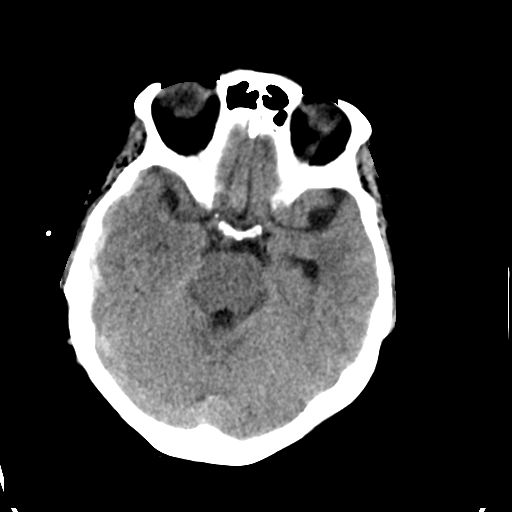
[im 10/30  bone]
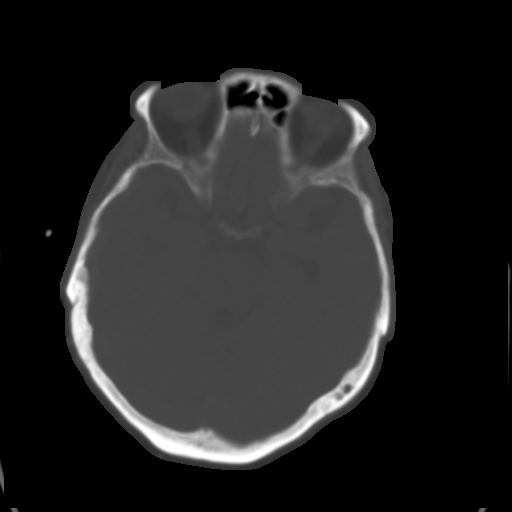
[im 12/30  brain]
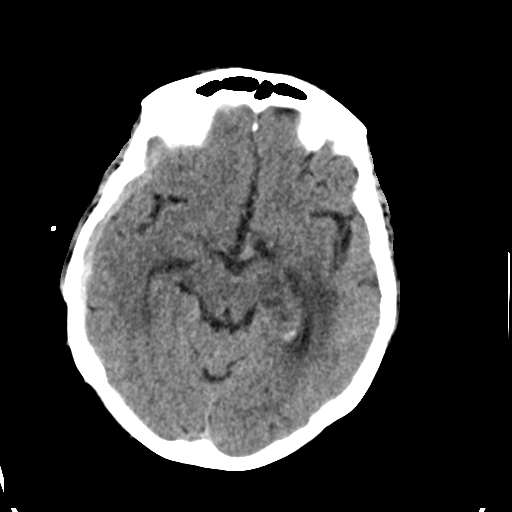
[im 14/30  brain]
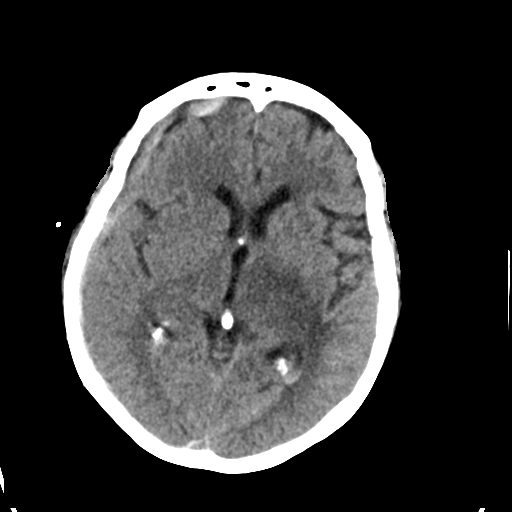
[im 16/30  brain]
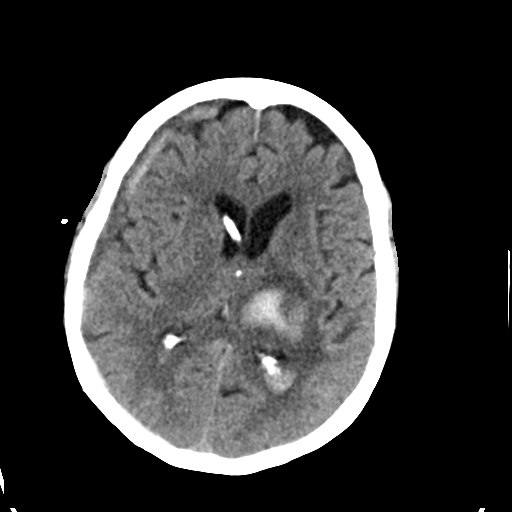
[im 17/30  brain]
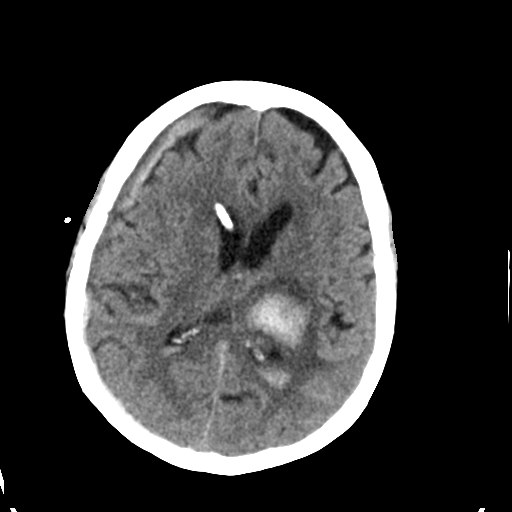
[im 17/30  bone]
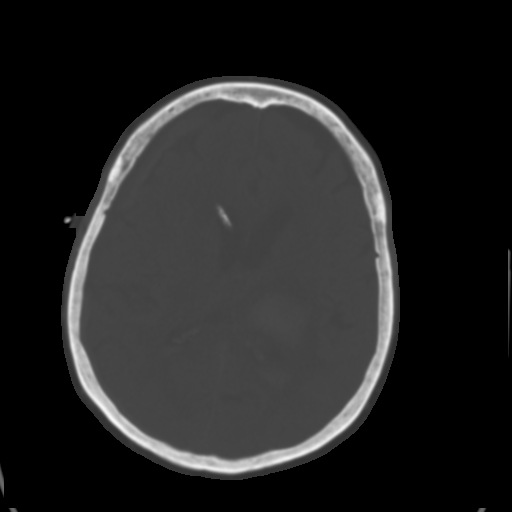
[im 19/30  brain]
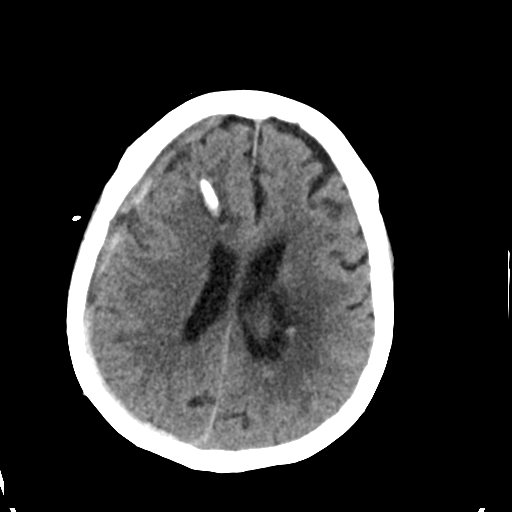
[im 21/30  brain]
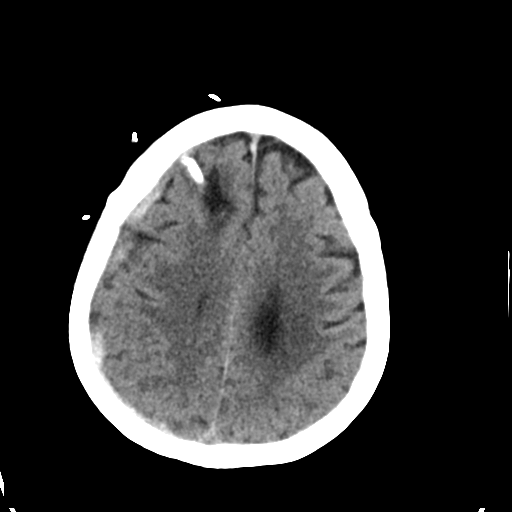
[im 23/30  brain]
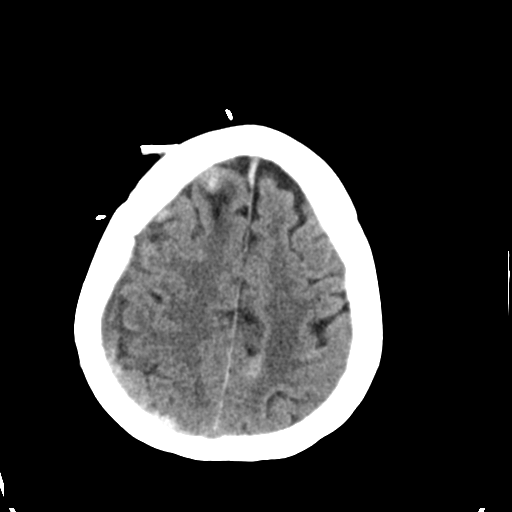
[im 25/30  brain]
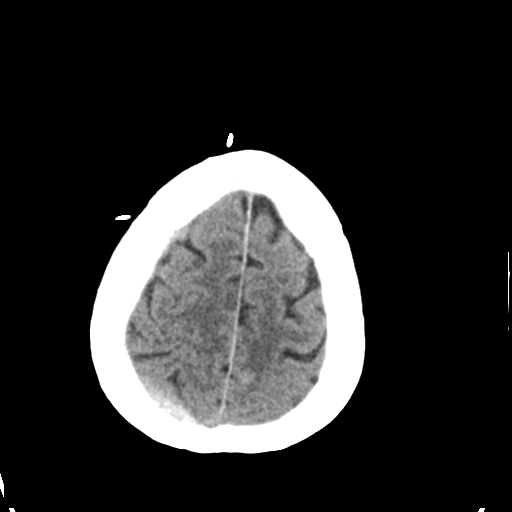
[im 25/30  bone]
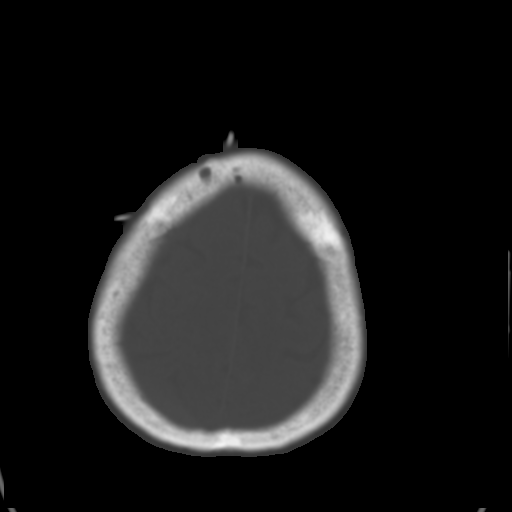
[im 27/30  brain]
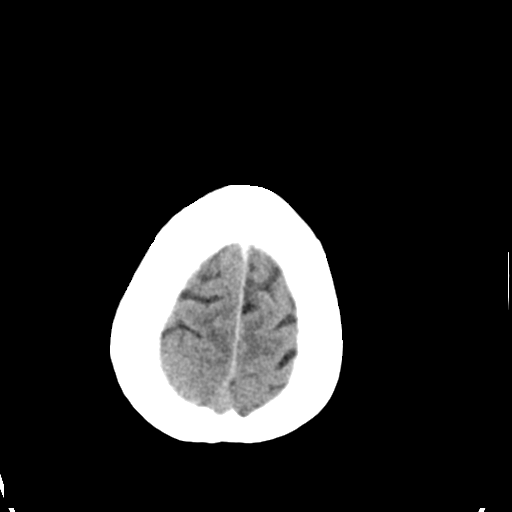
[im 29/30  brain]
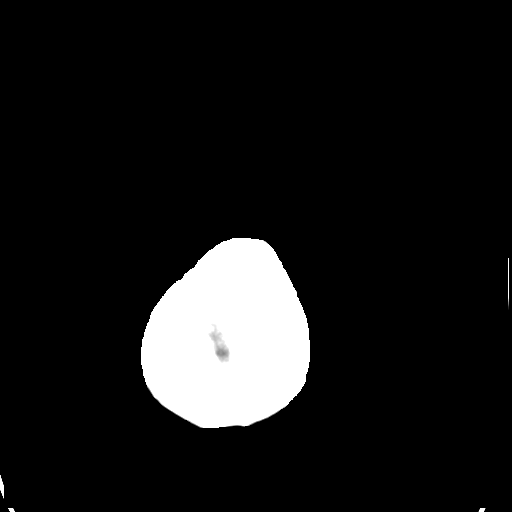

[15 of 30 positions shown; findings below may reference images not displayed]

FINDINGS: Left thalamic 3 cm hematoma has demonstrated expected evolution with
surrounding vasogenic edema.

Right frontal shunt catheter tip within the frontal horn of the
right lateral ventricle. Decrease in amount of intraventricular
blood. Decrease size of lateral ventricles. Clearing of previously
noted tiny amount of pneumocephalus.

Broad-based right-sided subdural complex hematoma measuring up to
9.1 mm without significant change. Mild mass effect upon the right
lateral ventricle. No significant midline shift.

Small amount of subarachnoid blood most notable left parietal
-occipital region without significant change.

No CT evidence of large acute thrombotic infarct.

No intracranial mass lesion seen separate from above described
findings.
IMPRESSION: Left thalamic 3 cm hematoma has demonstrated expected evolution with
surrounding vasogenic edema.

Right frontal shunt catheter tip within the frontal horn of the
right lateral ventricle. Decrease in amount of intraventricular
blood. Decrease size of lateral ventricles.

Broad-based right-sided subdural complex hematoma measuring up to
9.1 mm without significant change. Mild mass effect upon the right
lateral ventricle. No significant midline shift.

Small amount of subarachnoid blood most notable left parietal
-occipital region without significant change.

## 2015-11-16 IMAGING — CR DG CHEST 1V PORT
1 series · 1 of 1 positions shown · non-contrast
Comparison: 12/08/2014.

CLINICAL DATA: Pneumonia.

EXAM:
PORTABLE CHEST - 1 VIEW

[AP]
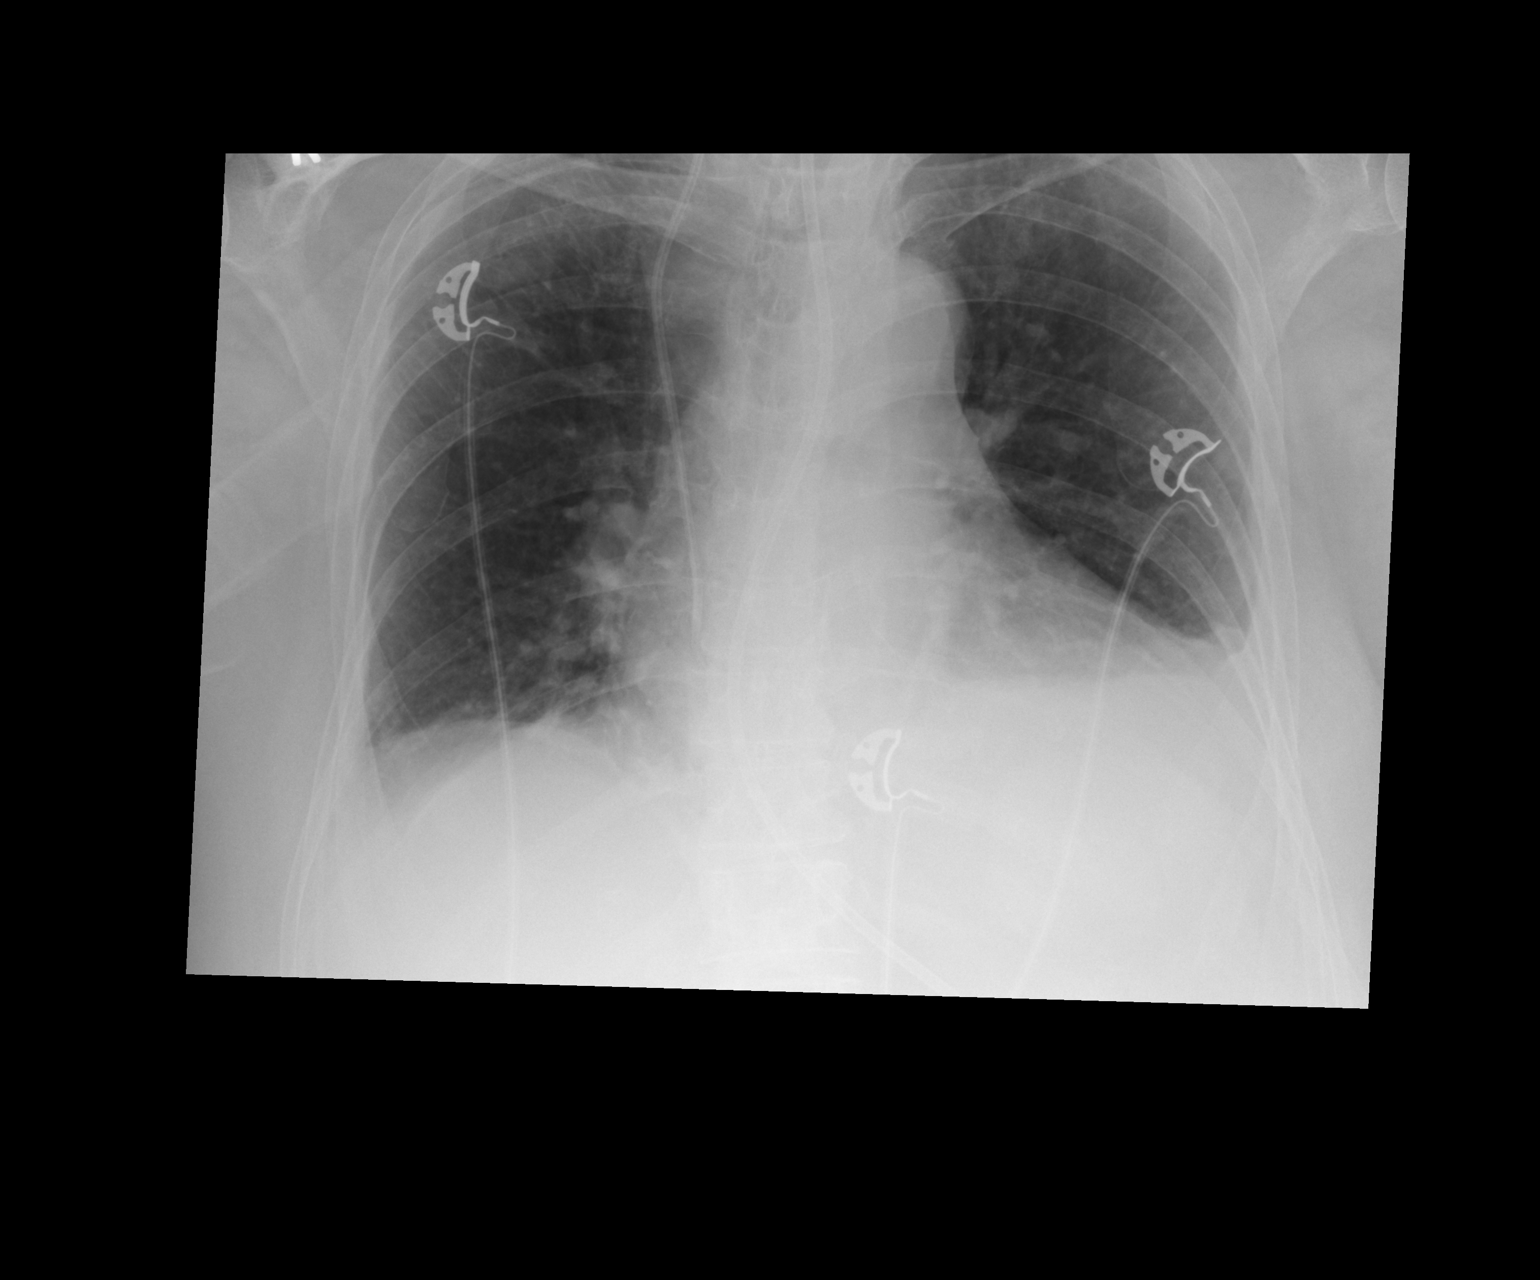

[1 of 1 positions shown; findings below may reference images not displayed]

FINDINGS: Support apparatus: Unchanged enteric tube and RIGHT IJ central line.
The tip of the RIGHT IJ central line is in the RIGHT atrium.

Cardiomediastinal Silhouette:  Unchanged.

Lungs: Bilateral basilar opacity, likely representing atelectasis.
Superimposed pneumonia not excluded. No pneumothorax.

Effusions:  Small LEFT

Other:  None.
IMPRESSION: 1. Unchanged support apparatus.
2. Bilateral basilar opacities, favored to represent atelectasis.
Superimposed pneumonia not excluded.
3. Small LEFT pleural effusion.
